# Patient Record
Sex: Male | Born: 1990 | Race: White | Hispanic: No | Marital: Single | Smoking: Current every day smoker
Health system: Southern US, Community
[De-identification: ages and names within clinical notes are randomized; demographics above are authoritative.]

## PROBLEM LIST (undated history)

## (undated) DIAGNOSIS — N179 Acute kidney failure, unspecified: Secondary | ICD-10-CM

## (undated) HISTORY — PX: HIP SURGERY: SHX245

---
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
## 2010-07-10 ENCOUNTER — Encounter: Payer: Self-pay | Admitting: Family Medicine

## 2010-07-10 ENCOUNTER — Encounter: Payer: Self-pay | Admitting: General Surgery

## 2010-11-01 NOTE — H&P (Signed)
[name], [name] NO.:  [id number]   MEDICAL RECORD NO.:  [id number]          PATIENT TYPE:  EMS   LOCATION:  ED                           FACILITY:  [facility]   PHYSICIAN:  Angelia Mould. Derrell Lolling, M.D.DATE OF BIRTH:  July 19, 1990   DATE OF ADMISSION:  12/05/2006  DATE OF DISCHARGE:                              HISTORY & PHYSICAL   CHIEF COMPLAINT:  Abdominal pain.   HISTORY OF PRESENT ILLNESS:  This is a 20 year old white man in good  health.  He presents with a 4-day history of progressive right lower  quadrant pain.  He has not had any similar symptoms in the past.  He  denies fever or chills.  He denies diarrhea or voiding symptoms.  Two  days ago, he had several episodes of nausea and vomiting.  He remains  anorexic, but the pain is getting worse.  He was evaluated at [facility] [place].  He was sent to  [facility] for a CT scan at [facility].  The CT scan shows an  inflamed appendix with some fluid and air bubbles around it and this is  read as showing a ruptured appendix, but no abscess.  No other pathology  was identified.  I was called.  He was sent to [facility] for  my evaluation.  He is being admitted for appendectomy.   PAST HISTORY:  Fractured ankle, otherwise healthy.   CURRENT MEDICATIONS:  None.   DRUG ALLERGIES:  None known.   SOCIAL HISTORY:  He is being home-schooled.  He is a rising 11th and  12th grader.  He denies alcohol or tobacco.  He works at [employer].  His mother and father are both with him today.   FAMILY HISTORY:  Mother living and has hyperlipidemia.  Father living  and has hypertension.   REVIEW OF SYSTEMS:  Fifteen-system review of systems is performed and is  noncontributory except as described above.   PHYSICAL EXAMINATION:  GENERAL:  Healthy young man, a bit overweight, in  moderate distress.  VITAL SIGNS:  Temperature has not been taken,  pulse is 98, respirations  18 and blood pressure has not been taken.  SKIN:  Warm and dry, slightly warm actually.  HEENT:  Eyes:  Sclerae are clear.  Extraocular movements are intact.  Ears, nose, mouth, throat, nose, lips, tongue and oropharynx are without  gross lesions.  NECK:  Supple and nontender.  No adenopathy.  No mass.  No jugular  distention.  LUNGS:  Clear to auscultation.  No chest wall tenderness.  HEART:  Regular rate and rhythm.  No murmur.  Radial, femoral and  posterior pulses are palpable.  No peripheral edema.  ABDOMEN:  Borderline obese, soft, tender with involuntary guarding in the right  lower quadrant and to a lesser extent, the suprapubic area, a lot of  spasticity, not obviously distended, minimal bowel sounds, no hernias  noted.  GENITOURINARY:  Normal penis, scrotum and testes.  I do not feel any  hernias or inguinal masses.  EXTREMITIES:  He moves all 4 extremities well without pain or deformity.  NEUROLOGIC:  No gross motor or sensory deficits.   IMPRESSION:  Acute appendicitis, likely ruptured.   PLAN:  The patient will be started on IV fluids, IV antibiotics and will  be taken to the operating room later today for appendectomy.   I have discussed the indication and details of surgery with the patient,  his mother and his father.  Risks and complications have been outlined,  including, but not limited to, bleeding, infection, conversion to open  laparotomy, injury to adjacent organs such as urinary tract, vascular  structures or intestine with major reconstructive surgery.  Alternative  diagnoses were discussed, wound problems such as infection or hernia,  cardiac, pulmonary and thromboembolic problems.  He seems to understand  these issues well.  At this time, all of his questions are answered.  Both the patient and his parents are in full agreement with this plan.      Angelia Mould. Derrell Lolling, M.D.  Electronically Signed     HMI/MEDQ  D:   12/05/2006  T:  12/06/2006  Job:  [id number]   cc:   Burnell Blanks, MD  Fax: [phone]

## 2010-11-01 NOTE — Op Note (Signed)
[name], [name] NO.:  [id number]   MEDICAL RECORD NO.:  [id number]          PATIENT TYPE:  INP   LOCATION:  0098                         FACILITY:  [facility]   PHYSICIAN:  Angelia Mould. Derrell Lolling, M.D.DATE OF BIRTH:  07-22-1990   DATE OF PROCEDURE:  12/05/2006  DATE OF DISCHARGE:                               OPERATIVE REPORT   PREOPERATIVE DIAGNOSIS:  Ruptured appendicitis.   POSTOPERATIVE DIAGNOSIS:  Ruptured appendicitis.   OPERATION PERFORMED:  Diagnostic laparoscopy, open appendectomy.   SURGEON:  Angelia Mould. Derrell Lolling, M.D.   OPERATIVE INDICATIONS:  This is a 20 year old white man who has had  progressive right lower quadrant abdominal pain for 4 days.  He has had  nausea and vomiting.  Denies fever or chills.  This has been getting  worse and he tried to deal with it at home and finally could not.  He  was sent for CT scanning today and it showed acute appendicitis with  fluid and air bubbles around the appendix suggesting rupture.  There was  no well formed abscess.  He is found to appear ill with tenderness in  the right lower quadrant.  He was started on antibiotics and brought to  operating room urgently.   OPERATIVE FINDINGS:  The appendix was gangrenous, acutely inflamed and  had ruptured.  There was walled off infected fluid around the appendix.  The omentum had walled this off and there was not diffuse peritonitis.  The inflammatory process was quite well developed making the appendix  and the cecum quite rigid and difficult to dissect necessitating open  surgery.   OPERATIVE TECHNIQUE:  Following induction of general endotracheal  anesthesia, a Foley catheter was inserted.  Intravenous antibiotics were  infused.  The abdomen was prepped and draped in sterile fashion.  The  patient was identified as correct patient, correct procedure.  0.5%  Marcaine with epinephrine was used as local infiltration anesthetic.  A  12-mm OptiVu port was placed in the  left rectus muscle just above the  umbilicus uneventfully.  Pneumoperitoneum was created.  Video camera was  inserted.  It was difficult to see although there did appear to be  inflammatory process in the right lower quadrant.  I placed a 5 mm  trocar in the left rectus muscle below the umbilicus and a 12-mm trocar  in the left rectus muscle in the suprapubic area.  We were able to  dissect the omentum off of this process.  There was some purulent fluid  there which was evacuated.  There was a foul odor.  We could see the tip  of the appendix but it was very densely adherent to surrounding  structures and the lateral wall and it just was not possible to dissect  this was safely and so we abandoned the laparoscopic approach.   A right lower quadrant muscle-splitting incision was made going through  the external oblique, internal oblique and transversus abdominis and  peritoneum in steps, placing handheld retractors.  We then identified  the tip of the appendix and with blunt dissection we were able to  mobilize this up slowly.  We had to divide some of the lateral  peritoneal attachments to mobilize the cecum.  The appendiceal mesentery  had to be divided in a couple of areas and tied off with 2-0 Vicryl  ties.  There was one area that kept bleeding and had to be suture  ligated with 2-0 silk suture ligature and that was very adequate for  hemostasis.  We continued dissection until we could clearly see the  junction of the appendix with the cecum.  At that point the appendix was  softer.  We placed a pursestring suture of 2-0 silk around the base of  the appendix in the cecum.  We tied off the appendix with a 2-0 Vicryl  tie and amputated the appendix and sent it to the lab.  The appendiceal  stump was then inverted and a pursestring suture tied.  This provided  very secure closure.  I placed one more suture of 2-0 silk to further  imbricate the tissues but actually we had very good  closure.  The area  was then copiously irrigated with saline and irrigated out with a pool  sucker.  There was no bleeding and no more purulence.  We checked the  terminal ileum and cecum and the omentum and everything looked fine.  The peritoneum was closed with running suture of 2-0 Vicryl.  The  internal oblique was closed with running suture of #1 PDS.  The external  oblique was closed with running suture of #1 PDS.  Skin was closed  loosely with skin staples.  Some Telfa wicks were placed between skin  staples in the right lower quadrant wound to provide drainage.  Clean  bandages were placed and the patient taken recovery room in stable  condition.  Estimated blood loss about 30-50 mL.  Complications none,  sponge, needle, instrument counts were correct.      Angelia Mould. Derrell Lolling, M.D.  Electronically Signed     HMI/MEDQ  D:  12/05/2006  T:  12/06/2006  Job:  [id number]   cc:   Burnell Blanks, MD  Fax: [phone]

## 2010-11-04 NOTE — Discharge Summary (Signed)
NAMELEMUEL, BOODRAM NO.:  [id number]   MEDICAL RECORD NO.:  [id number]          PATIENT TYPE:  INP   LOCATION:  1522                         FACILITY:  [facility]   PHYSICIAN:  Angelia Mould. Derrell Lolling, M.D.DATE OF BIRTH:  1990-12-05   DATE OF ADMISSION:  12/05/2006  DATE OF DISCHARGE:  12/10/2006                               DISCHARGE SUMMARY   FINAL DIAGNOSIS:  Ruptured appendicitis.   OPERATION PERFORMED:  Open appendectomy, date December 05, 2006.   HISTORY:  This is a healthy 20 year old white man, who presented with a  4-day history of progressive right lower quadrant pain.  He had some  nausea and vomiting.  The pain is getting worse.  CT scan was done at  [facility], and this showed an inflamed appendix with some fluid  air bubbles around it, felt to be a ruptured appendix.  No other  pathology was identified.  He was sent to [facility] Emergency Room.  I  was then called to see him.   Healthy young man, parents were present, pulse was 98, respirations 18.   SIGNIFICANT PHYSICAL FINDINGS:  Were the abdomen was soft with  tenderness and involuntary guarding in the right lower quadrant and also  was tender in the suprapubic area, lots of spasticity, not distended.  No hernias or mass noted.  GENITOURINARY:  Penis, scrotum and testes  were normal.  No hernias or other abnormalities.   HOSPITAL COURSE:  The patient was re-hydrated with IV fluids,  intravenous antibiotics were given, and he was taken to operating room.  Right lower quadrant incision was made, and we found that he had  advanced appendicitis with rupture and purulent fluid walled off in the  area, but we were able to get a good closure on the cecum.  We washed  him out very well, and the surgery was concluded.  Post-op, he did  fairly well.  He had spiked a fever to 103 that first night, but then  his temperature stayed down after that.  It took 2 or 3 days for him to  resume diet.  He did have  a fever of 101.7 on December 08, 2006, but then  remained afebrile for 36 hours thereafter.  He advanced in his diet and  activities and was feeling good.  On December 10, 2006, CBC was done which  showed the white blood cell count had come down from 23,000 to 10,000.  He had three bowel movements, tolerated a regular diet, was feeling  better and wanted to go home.  His wound was clean.  His staples were  removed, and Steri-Strips were applied.  He was given a prescription for  Augmentin for three more days and a prescription for Vicodin.  He was  asked to return see me in 3 weeks.      Angelia Mould. Derrell Lolling, M.D.  Electronically Signed     HMI/MEDQ  D:  01/08/2007  T:  01/08/2007  Job:  [id number]   cc:   Burnell Blanks, MD  Fax: [phone]

## 2011-04-05 LAB — CBC
HCT: 34.2 — ABNORMAL LOW
HCT: 38.6
HCT: 47
Hemoglobin: 11.8 — ABNORMAL LOW
Hemoglobin: 13.3
MCHC: 34.8
MCHC: 34.9
MCHC: 35
MCV: 83.7
Platelets: 294
Platelets: 336 — ABNORMAL HIGH
Platelets: 509 — ABNORMAL HIGH
RBC: 4.52
RBC: 4.57
RBC: 4.91
RDW: 12.8
RDW: 13.2
RDW: 13.3
RDW: 13.3
RDW: 13.3
WBC: 10.3 — ABNORMAL HIGH
WBC: 12.1 — ABNORMAL HIGH
WBC: 15.3 — ABNORMAL HIGH
WBC: 23 — ABNORMAL HIGH

## 2011-04-05 LAB — BASIC METABOLIC PANEL
BUN: 10
BUN: 10
CO2: 28
Calcium: 8.6
Calcium: 9
Calcium: 9.7
Chloride: 99
Creatinine, Ser: 0.8
Creatinine, Ser: 0.94
Creatinine, Ser: 0.95
Glucose, Bld: 101 — ABNORMAL HIGH
Glucose, Bld: 105 — ABNORMAL HIGH
Glucose, Bld: 107 — ABNORMAL HIGH
Glucose, Bld: 118 — ABNORMAL HIGH
Potassium: 4.7
Sodium: 136
Sodium: 141

## 2011-04-05 LAB — DIFFERENTIAL
Basophils Absolute: 0
Basophils Absolute: 0
Basophils Relative: 0
Basophils Relative: 0
Basophils Relative: 0
Eosinophils Absolute: 0.1
Eosinophils Absolute: 0.1
Eosinophils Relative: 1
Lymphocytes Relative: 13 — ABNORMAL LOW
Monocytes Relative: 7
Monocytes Relative: 8
Neutro Abs: 10.2 — ABNORMAL HIGH
Neutro Abs: 10.6 — ABNORMAL HIGH
Neutrophils Relative %: 78 — ABNORMAL HIGH
Neutrophils Relative %: 80 — ABNORMAL HIGH
Neutrophils Relative %: 84 — ABNORMAL HIGH

## 2011-04-05 LAB — ANAEROBIC CULTURE

## 2011-04-05 LAB — WOUND CULTURE

## 2011-04-05 LAB — URINALYSIS, ROUTINE W REFLEX MICROSCOPIC
Bilirubin Urine: NEGATIVE
Ketones, ur: 15 — AB
Nitrite: NEGATIVE
Protein, ur: NEGATIVE
Urobilinogen, UA: 0.2
pH: 6

## 2011-04-05 LAB — BODY FLUID CULTURE

## 2011-06-05 ENCOUNTER — Encounter: Payer: Self-pay | Admitting: Emergency Medicine

## 2011-06-05 ENCOUNTER — Emergency Department (HOSPITAL_COMMUNITY)
Admission: EM | Admit: 2011-06-05 | Discharge: 2011-06-05 | Disposition: A | Discharge status: Home / Self Care | Payer: Self-pay | Attending: Emergency Medicine | Admitting: Emergency Medicine

## 2011-06-05 ENCOUNTER — Emergency Department (HOSPITAL_COMMUNITY): Payer: Self-pay

## 2011-06-05 DIAGNOSIS — F17200 Nicotine dependence, unspecified, uncomplicated: Secondary | ICD-10-CM | POA: Insufficient documentation

## 2011-06-05 DIAGNOSIS — J40 Bronchitis, not specified as acute or chronic: Secondary | ICD-10-CM | POA: Insufficient documentation

## 2011-06-05 DIAGNOSIS — R51 Headache: Secondary | ICD-10-CM | POA: Insufficient documentation

## 2011-06-05 DIAGNOSIS — R062 Wheezing: Secondary | ICD-10-CM | POA: Insufficient documentation

## 2011-06-05 MED ORDER — IPRATROPIUM BROMIDE 0.02 % IN SOLN
0.5000 mg | Freq: Once | RESPIRATORY_TRACT | Status: AC
  Administered 2011-06-05: 0.5 mg via RESPIRATORY_TRACT
  Filled 2011-06-05: qty 2.5

## 2011-06-05 MED ORDER — AZITHROMYCIN 250 MG PO TABS: 250.0000 mg | ORAL_TABLET | Freq: Every day | ORAL | Status: AC

## 2011-06-05 MED ORDER — PROMETHAZINE-DM 6.25-15 MG/5ML PO SYRP: 2.5000 mL | ORAL_SOLUTION | Freq: Four times a day (QID) | ORAL | Status: AC | PRN

## 2011-06-05 MED ORDER — ALBUTEROL SULFATE HFA 108 (90 BASE) MCG/ACT IN AERS
2.0000 | INHALATION_SPRAY | RESPIRATORY_TRACT | Status: DC | PRN
  Administered 2011-06-05: 2 via RESPIRATORY_TRACT
  Filled 2011-06-05: qty 6.7

## 2011-06-05 MED ORDER — ALBUTEROL SULFATE (5 MG/ML) 0.5% IN NEBU
5.0000 mg | INHALATION_SOLUTION | Freq: Once | RESPIRATORY_TRACT | Status: AC
  Administered 2011-06-05: 5 mg via RESPIRATORY_TRACT
  Filled 2011-06-05: qty 1

## 2011-06-05 NOTE — ED Provider Notes (Signed)
History    this is a generally healthy 20 year old male with a smoking history is presenting to the ED with chief complaints of productive cough and chest congestion. Patient states for the past month and half he has been having persistent cough. Cough is sometimes productive of green sputum. For the past 2 weeks the cough has gotten worse. Cough is worse at night and in the morning.  He is also complaining of constant headache and described as a vice grip sensation. He also noticed aching pain throughout the body. He denies significant for nose, sore throat, sneezing. He denies, vomiting, diarrhea, abdominal pain, dysuria, rash, or fever. He admits to occasional use of marijuana but denies any recreational drug use. He has tried some over-the-counter medication without relief. He denies history of asthma.    CSN: [id number] Arrival date & time: 06/05/2011  7:13 AM   First MD Initiated Contact with Patient 06/05/11 [phone]      Chief Complaint  Patient presents with  . Headache    (Consider location/radiation/quality/duration/timing/severity/associated sxs/prior treatment) The history is provided by the patient. No language interpreter was used.    History reviewed. No pertinent past medical history.  Past Surgical History  Procedure Date  . Appendectomy     History reviewed. No pertinent family history.  History  Substance Use Topics  . Smoking status: Current Everyday Smoker -- 1.0 packs/day    Types: Cigarettes  . Smokeless tobacco: Not on file  . Alcohol Use: Yes      Review of Systems  All other systems reviewed and are negative.    Allergies  Review of patient's allergies indicates no known allergies.  Home Medications  No current outpatient prescriptions on file.  BP 147/89  Pulse 97  Temp(Src) 98.3 F (36.8 C) (Oral)  Resp 16  SpO2 97%  Physical Exam  Nursing note and vitals reviewed. Constitutional:       Awake, alert, nontoxic appearance  HENT:    Head: Atraumatic.  Right Ear: Hearing and tympanic membrane normal.  Left Ear: Hearing and tympanic membrane normal.  Nose: Nose normal.  Mouth/Throat: Uvula is midline, oropharynx is clear and moist and mucous membranes are normal.  Eyes: Right eye exhibits no discharge. Left eye exhibits no discharge.  Neck: Full passive range of motion without pain. Neck supple. No Brudzinski's sign noted.  Pulmonary/Chest: Effort normal. No respiratory distress. He has decreased breath sounds. He has wheezes. He has rhonchi. He has no rales. He exhibits no tenderness.  Abdominal: There is no tenderness. There is no rebound.  Musculoskeletal: He exhibits no tenderness.       Baseline ROM, no obvious new focal weakness  Neurological:       Mental status and motor strength appears baseline for patient and situation  Skin: No rash noted.  Psychiatric: He has a normal mood and affect.    ED Course  Procedures (including critical care time)  Labs Reviewed - No data to display No results found.   No diagnosis found.    MDM  On examination patient has moderate wheezes throughout all lung fields. He is currently afebrile, with stable normal vital signs.: Obtain a chest x-ray to rule out pneumonia. The breathing treatment will be given for relief of symptoms.   8:48 AM Chest x-ray shows evidence of bronchitis. Z-Pak given. Moderate amount of time were discussed in regard to smoking cessation, which patient's agree.     Fayrene Helper, [place] 06/05/11 [phone]

## 2011-06-05 NOTE — ED Notes (Signed)
Resp. Therp here instructing on inhaler use

## 2011-06-05 NOTE — ED Notes (Signed)
C/o headache, chest and head congestion for the past two weeks--rates headache a 7 on 1-10 scale of pain---Smokes one pack per day but, has been unable to smoke last few days due to [facility] and lung pain

## 2011-06-05 NOTE — ED Notes (Signed)
Pt states has had nasal congestion, headache x several weeks, and had a productive cough with green sputum x 1 month. States some times it hurts to breathe.

## 2011-06-05 NOTE — ED Provider Notes (Signed)
Medical screening examination/treatment/procedure(s) were performed by non-physician practitioner and as supervising physician I was immediately available for consultation/collaboration.   Rolan Bucco, MD 06/05/11 [phone]

## 2012-01-29 ENCOUNTER — Encounter (HOSPITAL_COMMUNITY): Payer: Self-pay

## 2012-01-29 ENCOUNTER — Emergency Department (HOSPITAL_COMMUNITY): Payer: Self-pay

## 2012-01-29 ENCOUNTER — Inpatient Hospital Stay (HOSPITAL_COMMUNITY)
Admission: EM | Admit: 2012-01-29 | Discharge: 2012-01-31 | DRG: 683 | Disposition: A | Discharge status: Home / Self Care | Payer: BC Managed Care – PPO | Attending: Internal Medicine | Admitting: Internal Medicine

## 2012-01-29 DIAGNOSIS — R5381 Other malaise: Secondary | ICD-10-CM | POA: Diagnosis present

## 2012-01-29 DIAGNOSIS — Z720 Tobacco use: Secondary | ICD-10-CM | POA: Diagnosis present

## 2012-01-29 DIAGNOSIS — D72829 Elevated white blood cell count, unspecified: Secondary | ICD-10-CM | POA: Diagnosis present

## 2012-01-29 DIAGNOSIS — R748 Abnormal levels of other serum enzymes: Secondary | ICD-10-CM | POA: Diagnosis present

## 2012-01-29 DIAGNOSIS — F17200 Nicotine dependence, unspecified, uncomplicated: Secondary | ICD-10-CM | POA: Diagnosis present

## 2012-01-29 DIAGNOSIS — E876 Hypokalemia: Secondary | ICD-10-CM | POA: Diagnosis present

## 2012-01-29 DIAGNOSIS — E871 Hypo-osmolality and hyponatremia: Secondary | ICD-10-CM | POA: Diagnosis present

## 2012-01-29 DIAGNOSIS — E860 Dehydration: Secondary | ICD-10-CM

## 2012-01-29 DIAGNOSIS — R0981 Nasal congestion: Secondary | ICD-10-CM | POA: Diagnosis present

## 2012-01-29 DIAGNOSIS — R5383 Other fatigue: Secondary | ICD-10-CM | POA: Diagnosis present

## 2012-01-29 DIAGNOSIS — D45 Polycythemia vera: Secondary | ICD-10-CM | POA: Diagnosis present

## 2012-01-29 DIAGNOSIS — R12 Heartburn: Secondary | ICD-10-CM | POA: Diagnosis present

## 2012-01-29 DIAGNOSIS — R0789 Other chest pain: Secondary | ICD-10-CM | POA: Diagnosis present

## 2012-01-29 DIAGNOSIS — N179 Acute kidney failure, unspecified: Principal | ICD-10-CM | POA: Diagnosis present

## 2012-01-29 DIAGNOSIS — R058 Other specified cough: Secondary | ICD-10-CM | POA: Diagnosis present

## 2012-01-29 DIAGNOSIS — R21 Rash and other nonspecific skin eruption: Secondary | ICD-10-CM | POA: Diagnosis present

## 2012-01-29 DIAGNOSIS — D751 Secondary polycythemia: Secondary | ICD-10-CM | POA: Diagnosis present

## 2012-01-29 DIAGNOSIS — E872 Acidosis, unspecified: Secondary | ICD-10-CM | POA: Diagnosis present

## 2012-01-29 DIAGNOSIS — IMO0001 Reserved for inherently not codable concepts without codable children: Secondary | ICD-10-CM | POA: Diagnosis present

## 2012-01-29 DIAGNOSIS — M6281 Muscle weakness (generalized): Secondary | ICD-10-CM | POA: Diagnosis present

## 2012-01-29 DIAGNOSIS — R05 Cough: Secondary | ICD-10-CM | POA: Diagnosis present

## 2012-01-29 DIAGNOSIS — R112 Nausea with vomiting, unspecified: Secondary | ICD-10-CM | POA: Diagnosis present

## 2012-01-29 DIAGNOSIS — Z23 Encounter for immunization: Secondary | ICD-10-CM

## 2012-01-29 HISTORY — DX: Rash and other nonspecific skin eruption: R21

## 2012-01-29 HISTORY — DX: Hypo-osmolality and hyponatremia: E87.1

## 2012-01-29 HISTORY — DX: Abnormal levels of other serum enzymes: R74.8

## 2012-01-29 HISTORY — DX: Acute kidney failure, unspecified: N17.9

## 2012-01-29 LAB — CBC
Hemoglobin: 18.6 g/dL — ABNORMAL HIGH (ref 13.0–17.0)
Platelets: 332 10*3/uL (ref 150–400)
RBC: 6.02 MIL/uL — ABNORMAL HIGH (ref 4.22–5.81)
WBC: 20.4 10*3/uL — ABNORMAL HIGH (ref 4.0–10.5)

## 2012-01-29 LAB — COMPREHENSIVE METABOLIC PANEL
ALT: 37 U/L (ref 0–53)
ALT: 29 U/L (ref 0–53)
AST: 34 U/L (ref 0–37)
AST: 22 U/L (ref 0–37)
Albumin: 5.9 g/dL — ABNORMAL HIGH (ref 3.5–5.2)
Alkaline Phosphatase: 82 U/L (ref 39–117)
CO2: 18 mEq/L — ABNORMAL LOW (ref 19–32)
CO2: 19 mEq/L (ref 19–32)
Chloride: 78 mEq/L — ABNORMAL LOW (ref 96–112)
Chloride: 85 mEq/L — ABNORMAL LOW (ref 96–112)
Creatinine, Ser: 4.64 mg/dL — ABNORMAL HIGH (ref 0.50–1.35)
GFR calc Af Amer: 23 mL/min — ABNORMAL LOW (ref >90)
GFR calc non Af Amer: 17 mL/min — ABNORMAL LOW (ref >90)
GFR calc non Af Amer: 20 mL/min — ABNORMAL LOW (ref >90)
Glucose, Bld: 167 mg/dL — ABNORMAL HIGH (ref 70–99)
Potassium: 3.7 mEq/L (ref 3.5–5.1)
Sodium: 125 mEq/L — ABNORMAL LOW (ref 135–145)
Sodium: 127 mEq/L — ABNORMAL LOW (ref 135–145)
Total Bilirubin: 0.6 mg/dL (ref 0.3–1.2)
Total Bilirubin: 0.5 mg/dL (ref 0.3–1.2)

## 2012-01-29 LAB — CBC WITH DIFFERENTIAL/PLATELET
Basophils Relative: 0 % (ref 0–1)
Eosinophils Relative: 0 % (ref 0–5)
Hemoglobin: 22 g/dL (ref 13.0–17.0)
Lymphocytes Relative: 5 % — ABNORMAL LOW (ref 12–46)
MCH: 31.4 pg (ref 26.0–34.0)
Monocytes Relative: 5 % (ref 3–12)
Neutrophils Relative %: 90 % — ABNORMAL HIGH (ref 43–77)
Platelets: 494 10*3/uL — ABNORMAL HIGH (ref 150–400)
RBC: 7 MIL/uL — ABNORMAL HIGH (ref 4.22–5.81)
WBC: 27.3 10*3/uL — ABNORMAL HIGH (ref 4.0–10.5)

## 2012-01-29 LAB — CK: Total CK: 941 U/L — ABNORMAL HIGH (ref 7–232)

## 2012-01-29 MED ORDER — SODIUM CHLORIDE 0.9 % IV SOLN
Freq: Once | INTRAVENOUS | Status: AC
  Administered 2012-01-29: 19:00:00 via INTRAVENOUS

## 2012-01-29 MED ORDER — ONDANSETRON HCL 4 MG PO TABS: 4.0000 mg | ORAL_TABLET | Freq: Four times a day (QID) | ORAL | Status: DC | PRN

## 2012-01-29 MED ORDER — SODIUM CHLORIDE 0.9 % IV BOLUS (SEPSIS)
1000.0000 mL | Freq: Once | INTRAVENOUS | Status: AC
Start: 2012-01-29 — End: 2012-01-30
  Administered 2012-01-30: 1000 mL via INTRAVENOUS

## 2012-01-29 MED ORDER — ONDANSETRON HCL 4 MG/2ML IJ SOLN: 4.0000 mg | Freq: Four times a day (QID) | INTRAMUSCULAR | Status: DC | PRN

## 2012-01-29 MED ORDER — SODIUM CHLORIDE 0.9 % IV SOLN
INTRAVENOUS | Status: DC
  Administered 2012-01-30: 01:00:00 via INTRAVENOUS

## 2012-01-29 NOTE — ED Notes (Signed)
Pt here with nausea, rash to abd, weakness, not feeling well.

## 2012-01-29 NOTE — ED Notes (Signed)
Critical lab: hemoglobin 22.0, Dr. Judd Lien notified

## 2012-01-29 NOTE — ED Notes (Signed)
ASked patient to provide urine sample.  He has said he is still unable to urinate at this time and requested apple juice.

## 2012-01-29 NOTE — H&P (Signed)
Hospital Admission Note Date: 01/29/2012  Patient name: [name] Medical record number: [id number] Date of birth: 1991/04/19 Age: 21 y.o. Gender: male PCP: DEFAULT,PROVIDER, MD  Medical Service:  Attending physician:     1st Contact:     Pager: 2nd Contact:     Pager: After 5 pm or weekends: 1st Contact:      Pager: [phone] 2nd Contact:      Pager: [phone]  Chief Complaint: Vomiting/malaise  History of Present Illness: Patient is a 21 y.o. M with no PMH. He presented to [facility] ED on 01/29/2012 with the complaints of nausea, vomiting, malaise, fatigue, and muscle cramps.  The patient had a cold approximately 10 days ago, which resolved spontaneously approximately 4 days prior to admission (significant only for nasal congestion and cough). Following resolution of these cold symptoms, the patient states that he noticed a rash developing around umbilicus, which extended to cover the entire abdomen, as well as his chest, back, arms, and legs over the course of 24 hours, beginning approximately 3-4 days prior to admission. He states that the rash has been persistent since this time. He complains of mild pruritis, but no pain associated with the lesions. He states he was seen in the ED at an OSH in [place] one day prior to admission, where he was given a three-day supply of diflucan and as he was not feeling quite as ill at that time was sent home without any further workup.   On the day of admission, he developed significant nausea and vomiting, as well as oliguria (possibly anuria, as patient states he might have possibly had some urine output earlier in the day but wasn't sure). He states that he had 8 episodes of emesis total, including one episode in the ED. He denies bloody or bilious emesis (to note, emesis observed on exam was brown, but patient was drinking a coke and color was lighter than typically observed with hematemesis). Patient denies any changes in urine output prior to today.    Patient states [place] and spends a significant amount of time outdoors. He admits to multiple tick bites over the last few months, the most recent of which he noticed approximately 2 months prior to admission. He states that he kept the tick for 2 weeks in the event that he developed symptoms, but subsequently discarded it as he did not become ill. 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 He states that during the last few days he has become tired very quickly while out in the sun. Additionally, he states becoming very fatigued on changing a tire in the sun within the last couple of days, and states that this is usually an easy task for him to complete without needing to rest (had to rest 3 times during event).   No current outpatient prescriptions on file.  Allergies: Allergies as of 01/29/2012  . (No Known Allergies)   No past medical history on file. Past Surgical History  Procedure Date  . Appendectomy    No family history on file. History   Social History  . Marital Status: Single    Spouse Name: N/A    Number of Children: N/A  . Years of Education: N/A   Occupational History  . Not on file.   Social History Main Topics  . Smoking status: Current Everyday Smoker -- 1.0 packs/day    Types: Cigarettes  . Smokeless tobacco: Not  on file  . Alcohol Use: Yes  . Drug Use: 7 per week    Special: Marijuana  . Sexually Active:    Other Topics Concern  . Not on file   Social History Narrative  . No narrative on file    Review of Systems: Pertinent +/- findings below. ROS otherwise per HPI.  General: Denies headache. Complains of fatigue, malaise, generalized weakness. Denies fever (has been taking temperature regularly at home the last 3-4 days). CV: Complains of bilateral chest pain x 1 day, which he believes is associated with breathing. Denies palpitations, leg  swelling. Resp: Complains of SOB x 2 days.  Abd: Complains of abdominal pain x 1 day. Complains of nausea, vomiting. Denies diarrhea or changes in bowel movements. GU: Complains of testicular pain approximately 1 week prior to presentation, and subsequent erectile dysfunction which has persisted since this time. Tried OTC erectile dysfunction agent which did not improve symptoms.  Physical Exam: Blood pressure 119/70, pulse 97, temperature 97.4 F (36.3 C), temperature source Oral, resp. rate 18, SpO2 92.00%. BP 118/91  Pulse 98  Temp 97.4 F (36.3 C) (Oral)  Resp 18  SpO2 96%  General Appearance:    Alert, cooperative, no distress, appears stated age  Head:    Normocephalic, without obvious abnormality, atraumatic, no ticks appreciated on cursory survey of scalp  Eyes:    PERRL, conjunctiva/corneas clear, EOM's intact, fundi    benign, both eyes       Nose:   Nares normal, septum midline, mucosa normal, no drainage    or sinus tenderness  Throat:   Dry mucus membranes.  Neck:   Supple, symmetrical, trachea midline, no adenopathy;       thyroid:  No enlargement/tenderness/nodules  Lungs:     Clear to auscultation bilaterally, respirations unlabored  Chest wall:    No tenderness or deformity  Heart:    Regular rate and rhythm, S1 and S2 normal, no murmur, rub   or gallop  Abdomen:     Soft, diffusely tender, bowel sounds active all four quadrants,    no masses, no organomegaly  Extremities:   Extremities normal, atraumatic, no cyanosis or edema  Pulses:   2+ and symmetric all extremities  Skin:   Maculopapular rash appreciated on trunk as well as all extremities. Rash is most concentrated on abdomen. In appearance, lesions are 1-69mm, maculopapular, with occasional pustulation; no ecchymoses, or other lesions appreciated     Neurologic:   Normal strength, sensation and reflexes      throughout   Lab results: Basic Metabolic Panel:  Basename 01/29/12 1800  NA 125*  K 3.7  CL 78*   CO2 18*  GLUCOSE 208*  BUN 69*  CREATININE 4.64*  CALCIUM 10.6*  MG --  PHOS --   Liver Function Tests:  Basename 01/29/12 1800  AST 34  ALT 37  ALKPHOS 97  BILITOT 0.6  PROT 10.5*  ALBUMIN 5.9*   CBC:  Basename 01/29/12 1800  WBC 27.3*  NEUTROABS 24.5*  HGB 22.0*  HCT 55.3*  MCV 79.0  PLT 494*   Cardiac Enzymes:  Basename 01/29/12 2005 01/29/12 1956  CKTOTAL -- 941*  CKMB -- --  CKMBINDEX -- --  TROPONINI <0.30 --   Imaging results:  Dg Chest Port 1 View  01/29/2012  *RADIOLOGY REPORT*  Clinical Data: Cough.  Leukocytosis.  PORTABLE CHEST - 1 VIEW  Comparison: 06/05/2011  Findings: Negative for pneumonia or effusion.  Lungs are clear. Negative for heart failure.  IMPRESSION: No active cardiopulmonary disease.  Original Report Authenticated By: Camelia Phenes, M.D.   Other results: EKG: Sinus tachycardia; rate ~110-115. T-wave inversion in III, aVF. Prominent P-waves in inferior leads.  ASSESSMENT AND PLAN: Patient is a 21 y.o. M without known PMH who presents with nausea, vomiting, chest pain, SOB, fatigue, malaise. He was admitted for acute kidney injury, as well as for AG metabolic acidosis, nausea/vomiting/rash, and chest pain/SOB  Acute kidney injury - Creatinine on admission = 4.6. Baseline unknown, but unlikely >1, given patient's lack of PMH and his relatively young age. Subsequent creatinine after IV hydration revealed creatinine decreased from 4.6 to 4.0. There is significant concern at present for the development of ATN and/or the development of heme-pigment mediated kidney injury (the latter being a concern due to the patient's CK ~ 1000.  - admit to stepdown - monitor vitals - monitor I/O's - IV rehydration with normal saline - BMETs q4 - check urine myoglobin  Anion gap metabolic acidosis - AG = 29 on presentation, decreased to 23 after IV hydration with NS. Etiology unknown. Patient has significant volume depletion 2/2 insensible losses.  Interventions at this time will be directed towards further investigated the cause of patient's AG metabolic acidosis, as well as treating with IV normal saline and monitoring for resolution. - Check ABG - BMETs q4 - check UA - check urine ketones - continue to monitor blood glucose, as patient had glucose ~200 on admission  Chest pain - Etiology unknown. Patient states pain is associated with breathing, however also states it has a hearburn component. ACS unlikely given age and negative troponin, although EKG revealed TWI in leads III and aVF. GENEVA score = 5, however d-dimer was negative, thus pulmonary embolism less likely. Pneumonia unlikely given negative CXR.  - check CE - repeat EKG in AM  Rash/nausea/vomiting - These symptoms likely share an underlying etiology given temporal relationship in presentation and patient's lack of history of these symptoms in the past. At this time it is unclear what the cause of these symptoms is, and whether it might be related to the patient's AKI and AG metabolic acidosis.  Patient has history of tick bites, however is afebrile, which would be highly atypical presentation of RMSF. Drug eruption is possible, although patient denies any drug use, either illicit or as prescribed, other than recent ingestion of adderall (taken not as prescribed). Viral illnesses such as mononucleosis and other viral exanthems might explain his symptoms, but these also would more commonly be accompanied by febrile illness. Syphilis is a possibility, as is HIV. Interventions at this time will be directed towards investigating the underlying etiology of these symptoms, and towards treating the symptoms supportively until resolved. - check mononucleosis serology - check RMSF serology (although unlikely to be positive <5 days following onset of symptoms) -  check HIV serology - check RPR - consider skin biopsy if lesions do not improve or worsen in 24 hours  Hyponatremia - Na on  admission = 125. Likely secondary to insensible losses. Other underlying etiologies undetermined at this point.  - IV normal saline (with goal Na = 140, but with 24-hr correction goal <68mEq/L, i.e. Na <138 at 1800 on 01/30/2012)  Hypermagnesemia/hyperphosphatemia/hypercalcemia - patient had all three aforementioned electrolyte abnormalities at presentation, Ca subsequently corrected following IV fluid hydration (10.6 -> 9.3). As with other labs, patient appears to have marked hemoconcentration which makes understanding the chronicity of these abnormalities very difficult at this time. Interventions will be directed towards  repleting the volume deficit and reassessing the patient's serum electrolytes periodically. - IV normal saline - BMETs q4 - if symptoms change, will consider further interventions for electrolyte derangements as necessary  Leukocytosis - unclear etiology. If the patient truly has an infectious process driving his symptomatology and laboratory abnormalities, it is likely that this value is a result of this. If the patient does not have an acute infection, it is possible but unlikely that this could represent a stress response (the patient is very hemoconcentrated and WBC decreased from 27.3 to 20.4 after IV hydration).  - monitor vitals - monitor CBCs  Polycythemia - Hgb = 22.0 on admission. Hgb = 18.6 after rehydration. Likely largely due to hemoconcentration.  - monitor CBCs  Signed: Elenor Legato, M.D. 01/29/2012, 9:57 PM

## 2012-01-29 NOTE — ED Notes (Signed)
Apple juice given, pts family/friend at bedside.

## 2012-01-29 NOTE — ED Notes (Signed)
Patient vomited all of the apple juice and more.  Linens changed, gown changed.  Significant other in room with patient

## 2012-01-29 NOTE — ED Provider Notes (Signed)
History     CSN: [id number]  Arrival date & time 01/29/12  1739   First MD Initiated Contact with Patient 01/29/12 1851      Chief Complaint  Patient presents with  . Generalized Body Aches  . URI  . Nausea  . Rash    (Consider location/radiation/quality/duration/timing/severity/associated sxs/prior treatment) HPI Comments: Patient with uri-like symptoms one week ago, then developed rash all over.  Since that time, he has become increasingly weak, fatigued, tired.  Is now "cramping up all over".  Feels thirsty.  Was seen at outside facility and was discharged without much workup.  He comes here with worsening of symptoms.    Patient is a 21 y.o. male presenting with weakness. The history is provided by the patient.  Weakness The primary symptoms include dizziness and nausea. Primary symptoms do not include fever. Episode onset: one week ago. The symptoms are worsening. The neurological symptoms are diffuse.  Dizziness also occurs with nausea, weakness and diaphoresis.  Additional symptoms include weakness.    No past medical history on file.  Past Surgical History  Procedure Date  . Appendectomy     No family history on file.  History  Substance Use Topics  . Smoking status: Current Everyday Smoker -- 1.0 packs/day    Types: Cigarettes  . Smokeless tobacco: Not on file  . Alcohol Use: Yes      Review of Systems  Constitutional: Positive for diaphoresis, appetite change and fatigue. Negative for fever and chills.  Gastrointestinal: Positive for nausea.  Neurological: Positive for dizziness and weakness.  All other systems reviewed and are negative.    Allergies  Review of patient's allergies indicates no known allergies.  Home Medications  No current outpatient prescriptions on file.  BP 114/62  Pulse 101  Temp 97.3 F (36.3 C) (Oral)  Resp 18  SpO2 93%  Physical Exam  Nursing note and vitals reviewed. Constitutional: He is oriented to person,  place, and time.       Appears pale, uncomfortable.  HENT:  Head: Normocephalic.       Oropharynx is dry  Eyes: EOM are normal. Pupils are equal, round, and reactive to light.  Neck: Normal range of motion. Neck supple.  Cardiovascular: Normal rate and regular rhythm.   No murmur heard. Pulmonary/Chest: Effort normal and breath sounds normal. No respiratory distress. He has no wheezes.  Abdominal: Soft. Bowel sounds are normal. He exhibits no distension. There is no tenderness.  Musculoskeletal: Normal range of motion. He exhibits no edema.  Lymphadenopathy:    He has no cervical adenopathy.  Neurological: He is alert and oriented to person, place, and time. No cranial nerve deficit. Coordination normal.  Skin: Skin is warm and dry.    ED Course  Procedures (including critical care time)  Labs Reviewed  COMPREHENSIVE METABOLIC PANEL - Abnormal; Notable for the following:    Sodium 125 (*)     Chloride 78 (*)     CO2 18 (*)     Glucose, Bld 208 (*)     BUN 69 (*)     Creatinine, Ser 4.64 (*)     Calcium 10.6 (*)     Total Protein 10.5 (*)     Albumin 5.9 (*)     GFR calc non Af Amer 17 (*)     GFR calc Af Amer 19 (*)     All other components within normal limits  CBC WITH DIFFERENTIAL  URINALYSIS, ROUTINE W REFLEX MICROSCOPIC  ROCKY  MTN SPOTTED FVR AB, IGG-BLOOD  ROCKY MTN SPOTTED FVR AB, IGM-BLOOD  CBC WITH DIFFERENTIAL  COMPREHENSIVE METABOLIC PANEL  TROPONIN I   No results found.   No diagnosis found.   Date: 01/29/2012  Rate: 112  Rhythm: normal sinus rhythm  QRS Axis: normal  Intervals: normal  ST/T Wave abnormalities: nonspecific T wave changes  Conduction Disutrbances:none  Narrative Interpretation:   Old EKG Reviewed: none available    MDM  The patient presents with severe weakness and "cramping all over" that has been progressing since last week.  On exam, the patient appears quite dehydrated.  He is pale and his eyes are sunken.  The mucous  membranes are dry.  IVF's were initiated and workup was performed.  This revealed acute renal failure, elevated Hb, and elevated wbc, all consistent with severe dehydration.  I have consulted internal medicine for admission.          Geoffery Lyons, MD 01/30/12 0201

## 2012-01-30 ENCOUNTER — Encounter (HOSPITAL_COMMUNITY): Payer: Self-pay | Admitting: Emergency Medicine

## 2012-01-30 DIAGNOSIS — E872 Acidosis: Secondary | ICD-10-CM

## 2012-01-30 DIAGNOSIS — E876 Hypokalemia: Secondary | ICD-10-CM

## 2012-01-30 DIAGNOSIS — R12 Heartburn: Secondary | ICD-10-CM | POA: Diagnosis present

## 2012-01-30 DIAGNOSIS — R21 Rash and other nonspecific skin eruption: Secondary | ICD-10-CM

## 2012-01-30 DIAGNOSIS — N179 Acute kidney failure, unspecified: Principal | ICD-10-CM

## 2012-01-30 DIAGNOSIS — E871 Hypo-osmolality and hyponatremia: Secondary | ICD-10-CM

## 2012-01-30 DIAGNOSIS — R5381 Other malaise: Secondary | ICD-10-CM

## 2012-01-30 HISTORY — DX: Hypokalemia: E87.6

## 2012-01-30 LAB — CBC
HCT: 44 % (ref 39.0–52.0)
Hemoglobin: 16.9 g/dL (ref 13.0–17.0)
MCH: 30.8 pg (ref 26.0–34.0)
MCHC: 38.4 g/dL — ABNORMAL HIGH (ref 30.0–36.0)
MCV: 80.3 fL (ref 78.0–100.0)

## 2012-01-30 LAB — BASIC METABOLIC PANEL
BUN: 58 mg/dL — ABNORMAL HIGH (ref 6–23)
Calcium: 8.3 mg/dL — ABNORMAL LOW (ref 8.4–10.5)
Calcium: 9.1 mg/dL (ref 8.4–10.5)
Creatinine, Ser: 2.24 mg/dL — ABNORMAL HIGH (ref 0.50–1.35)
GFR calc non Af Amer: 40 mL/min — ABNORMAL LOW (ref >90)
GFR calc non Af Amer: 70 mL/min — ABNORMAL LOW (ref >90)
Glucose, Bld: 162 mg/dL — ABNORMAL HIGH (ref 70–99)
Glucose, Bld: 101 mg/dL — ABNORMAL HIGH (ref 70–99)
Sodium: 130 mEq/L — ABNORMAL LOW (ref 135–145)

## 2012-01-30 LAB — HEMOGLOBIN A1C: Mean Plasma Glucose: 108 mg/dL (ref <117)

## 2012-01-30 LAB — OSMOLALITY, URINE: Osmolality, Ur: 576 mOsm/kg (ref 390–1090)

## 2012-01-30 LAB — MAGNESIUM: Magnesium: 2.7 mg/dL — ABNORMAL HIGH (ref 1.5–2.5)

## 2012-01-30 LAB — URINALYSIS, ROUTINE W REFLEX MICROSCOPIC
Glucose, UA: NEGATIVE mg/dL
Protein, ur: 30 mg/dL — AB
Specific Gravity, Urine: 1.026 (ref 1.005–1.030)
Urobilinogen, UA: 0.2 mg/dL (ref 0.0–1.0)

## 2012-01-30 LAB — POCT I-STAT 3, ART BLOOD GAS (G3+)
Bicarbonate: 23.2 mEq/L (ref 20.0–24.0)
O2 Saturation: 95 %
Patient temperature: 98.6
TCO2: 24 mmol/L (ref 0–100)

## 2012-01-30 LAB — RAPID URINE DRUG SCREEN, HOSP PERFORMED
Barbiturates: NOT DETECTED
Benzodiazepines: NOT DETECTED
Tetrahydrocannabinol: NOT DETECTED

## 2012-01-30 LAB — DRUGS OF ABUSE SCREEN W/O ALC, ROUTINE URINE
Barbiturate Quant, Ur: NEGATIVE
Cocaine Metabolites: NEGATIVE
Methadone: NEGATIVE
Phencyclidine (PCP): NEGATIVE

## 2012-01-30 LAB — ROCKY MTN SPOTTED FVR AB, IGG-BLOOD: RMSF IgG: 0.51 IV

## 2012-01-30 LAB — URINE MICROSCOPIC-ADD ON

## 2012-01-30 LAB — LACTATE DEHYDROGENASE: LDH: 196 U/L (ref 94–250)

## 2012-01-30 LAB — ROCKY MTN SPOTTED FVR AB, IGM-BLOOD: RMSF IgM: 0.98 IV — ABNORMAL HIGH (ref 0.00–0.89)

## 2012-01-30 LAB — KETONES, QUALITATIVE: Acetone, Bld: NEGATIVE

## 2012-01-30 LAB — LIPASE, BLOOD: Lipase: 26 U/L (ref 11–59)

## 2012-01-30 LAB — MONONUCLEOSIS SCREEN: Mono Screen: NEGATIVE

## 2012-01-30 LAB — PROTIME-INR: INR: 1.17 (ref 0.00–1.49)

## 2012-01-30 MED ORDER — SODIUM CHLORIDE 0.9 % IV SOLN
INTRAVENOUS | Status: DC
  Administered 2012-01-30 – 2012-01-31 (×4): via INTRAVENOUS
  Filled 2012-01-30 (×7): qty 1000

## 2012-01-30 MED ORDER — DOXYCYCLINE HYCLATE 100 MG IV SOLR
100.0000 mg | Freq: Two times a day (BID) | INTRAVENOUS | Status: DC
  Administered 2012-01-30 (×3): 100 mg via INTRAVENOUS
  Filled 2012-01-30 (×5): qty 100

## 2012-01-30 MED ORDER — NICOTINE 14 MG/24HR TD PT24
14.0000 mg | MEDICATED_PATCH | Freq: Every day | TRANSDERMAL | Status: DC
  Administered 2012-01-30: 14 mg via TRANSDERMAL
  Filled 2012-01-30: qty 1

## 2012-01-30 MED ORDER — DIPHENHYDRAMINE HCL 25 MG PO CAPS: 25.0000 mg | ORAL_CAPSULE | Freq: Four times a day (QID) | ORAL | Status: DC | PRN

## 2012-01-30 MED ORDER — PNEUMOCOCCAL VAC POLYVALENT 25 MCG/0.5ML IJ INJ
0.5000 mL | INJECTION | INTRAMUSCULAR | Status: AC
  Administered 2012-01-31: 0.5 mL via INTRAMUSCULAR
  Filled 2012-01-30: qty 0.5

## 2012-01-30 MED ORDER — POTASSIUM CHLORIDE CRYS ER 20 MEQ PO TBCR
40.0000 meq | EXTENDED_RELEASE_TABLET | Freq: Once | ORAL | Status: AC
  Administered 2012-01-30: 40 meq via ORAL
  Filled 2012-01-30: qty 2

## 2012-01-30 MED ORDER — FAMOTIDINE 40 MG PO TABS
40.0000 mg | ORAL_TABLET | Freq: Every day | ORAL | Status: DC
  Administered 2012-01-30 – 2012-01-31 (×2): 40 mg via ORAL
  Filled 2012-01-30 (×2): qty 1

## 2012-01-30 MED ORDER — NICOTINE 21 MG/24HR TD PT24
21.0000 mg | MEDICATED_PATCH | Freq: Every day | TRANSDERMAL | Status: DC
  Administered 2012-01-30 – 2012-01-31 (×2): 21 mg via TRANSDERMAL
  Filled 2012-01-30 (×2): qty 1

## 2012-01-30 MED ORDER — ALUM & MAG HYDROXIDE-SIMETH 200-200-20 MG/5ML PO SUSP
30.0000 mL | ORAL | Status: DC | PRN
  Administered 2012-01-30: 30 mL via ORAL
  Filled 2012-01-30: qty 30

## 2012-01-30 MED ORDER — ACETAMINOPHEN 650 MG RE SUPP: 650.0000 mg | Freq: Four times a day (QID) | RECTAL | Status: DC | PRN

## 2012-01-30 MED ORDER — ACETAMINOPHEN 325 MG PO TABS: 650.0000 mg | ORAL_TABLET | Freq: Four times a day (QID) | ORAL | Status: DC | PRN

## 2012-01-30 NOTE — Progress Notes (Signed)
Utilization review completed.  

## 2012-01-30 NOTE — Progress Notes (Signed)
ED RT paged ABG Results from last night: results were unsuccessfully loaded to EPIC and are as follows:  pH: 7.36 pCO2: 40.5 pO2: 80 Bicarb: 23

## 2012-01-30 NOTE — H&P (Signed)
IM Attending  18 man with a rash illness of 7-10 days duration w/o fever or headache.  Developed anorexia and then severe vomiting.  Came in with nl temp but astounding labs: Na = 125, creat = 4.6, anion gap = 29, albumin = 5.9, Hgb = 22,000.  LFTs nl. Lactate, aspirin, ketones negative.  No hint of toxic ingestion. Overnight after large infusion of IV saline: creat = 2.2, anion gap = 14, hgb = 17,000.  On doxycycline. He feels washed out but has minimal focal sx.  Rash is papular erythema with mild pruritis. Impression:  Rash illness.  has outdoor exposures but no fever or headache.  Most of his dramatic manifestations are due to volume contraction.

## 2012-01-30 NOTE — Progress Notes (Signed)
Subjective: Vomiting resolved, c/o heart burn; denies sob; denies ab pain.  Pt reports increased urine output pressure sensation when urination.  Pt reports aching muscles in his arms and legs.  Pt able to tolerate po intake. Pt reports he has been sleepy all day possibly b/c he has not slept in 2 days.    Objective: Vital signs in last 24 hours: Filed Vitals:   01/30/12 0414 01/30/12 0800 01/30/12 1200 01/30/12 1600  BP: 119/67 127/73 145/82   Pulse: 78     Temp: 97.9 F (36.6 C) 97.9 F (36.6 C) 98 F (36.7 C) 98.1 F (36.7 C)  TempSrc: Oral Oral Oral Oral  Resp: 19     Height:      Weight:      SpO2: 96%      Weight change:   Intake/Output Summary (Last 24 hours) at 01/30/12 1802 Last data filed at 01/30/12 1500  Gross per 24 hour  Intake 976.67 ml  Output   2125 ml  Net -1148.33 ml   Vitals reviewed. General: resting in bed, drowsy but arousable, NAD HEENT: [place]/AT, no scleral icterus Cardiac: RRR, no rubs, murmurs or gallops Pulm: clear to auscultation bilaterally, no wheezes, rales, or rhonchi Abd: soft, min TTP ruq, nondistended, BS present (normactive) Ext: warm and well perfused, no pedal edema Neuro: alert and oriented X3  Lab Results: Basic Metabolic Panel:  Lab 01/30/12 4098 01/30/12 0355 01/29/12 2330  NA 130* 128* --  K 3.3* 3.0* --  CL 93* 90* --  CO2 27 24 --  GLUCOSE 101* 162* --  BUN 43* 58* --  CREATININE 1.41* 2.24* --  CALCIUM 9.1 8.3* --  MG -- -- 2.7*  PHOS -- -- 6.9*   Liver Function Tests:  Lab 01/29/12 2203 01/29/12 1800  AST 22 34  ALT 29 37  ALKPHOS 82 97  BILITOT 0.5 0.6  PROT 9.0* 10.5*  ALBUMIN 4.9 5.9*    Lab 01/29/12 2330  LIPASE 26  AMYLASE --   CBC:  Lab 01/30/12 0355 01/29/12 2203 01/29/12 1800  WBC 16.0* 20.4* --  NEUTROABS -- -- 24.5*  HGB 16.9 18.6* --  HCT 44.0 48.3 --  MCV 80.3 80.2 --  PLT 316 332 --   Cardiac Enzymes:  Lab 01/30/12 0855 01/29/12 2005 01/29/12 1956  CKTOTAL 883* -- 941*  CKMB --  -- --  CKMBINDEX -- -- --  TROPONINI -- <0.30 --   D-Dimer:  Lab 01/29/12 2330  DDIMER 0.22   Hemoglobin A1C:  Lab 01/29/12 2330  HGBA1C 5.4   Thyroid Function Tests:  Lab 01/29/12 2330  TSH 1.308  T4TOTAL --  FREET4 --  T3FREE --  THYROIDAB --   Coagulation:  Lab 01/29/12 2330  LABPROT 15.1  INR 1.17   Urine Drug Screen: Drugs of Abuse     Component Value Date/Time   LABOPIA POSITIVE* 01/30/2012 0100   LABOPIA POSITIVE* 01/30/2012 0100   COCAINSCRNUR NEGATIVE 01/30/2012 0100   COCAINSCRNUR NONE DETECTED 01/30/2012 0100   LABBENZ NEGATIVE 01/30/2012 0100   LABBENZ NONE DETECTED 01/30/2012 0100   AMPHETMU POSITIVE* 01/30/2012 0100   AMPHETMU POSITIVE* 01/30/2012 0100   THCU NONE DETECTED 01/30/2012 0100   LABBARB NONE DETECTED 01/30/2012 0100    Urinalysis:  Lab 01/30/12 0100  COLORURINE YELLOW  LABSPEC 1.026  PHURINE 5.5  GLUCOSEU NEGATIVE  HGBUR TRACE*  BILIRUBINUR SMALL*  KETONESUR NEGATIVE  PROTEINUR 30*  UROBILINOGEN 0.2  NITRITE NEGATIVE  LEUKOCYTESUR NEGATIVE   Misc. Labs: Results  for KAINON, VARADY (MRN [id number]) as of 01/30/2012 18:06  Ref. Range 01/30/2012 00:00  pH, Arterial Latest Range: 7.350-7.450  7.365  pCO2 arterial Latest Range: 35.0-45.0 mmHg 40.5  pO2, Arterial Latest Range: 80.0-100.0 mmHg 80.0  Bicarbonate Latest Range: 20.0-24.0 mEq/L 23.2  TCO2 Latest Range: 0-100 mmol/L 24  Acid-base deficit Latest Range: 0.0-2.0 mmol/L 2.0  O2 Saturation No range found 95.0  Patient temperature No range found 98.6 F   Results for MANU, RUBEY (MRN [id number]) as of 01/30/2012 18:06  Ref. Range 01/29/2012 23:30  Phosphorus Latest Range: 2.3-4.6 mg/dL 6.9 (H)  Magnesium Latest Range: 1.5-2.5 mg/dL 2.7 (H)  Lipase Latest Range: 11-59 U/L 26   Results for BRENTIN, SHIN (MRN [id number]) as of 01/30/2012 18:06  Ref. Range 01/30/2012 08:55  CK Total Latest Range: 7-232 U/L 883 (H)   Results for MICAHEL, OMLOR (MRN [id number]) as of 01/30/2012 18:06  Ref.  Range 01/29/2012 23:30  LDH Latest Range: 94-250 U/L 196   Results for KOHEI, ANTONELLIS (MRN [id number]) as of 01/30/2012 18:06  Ref. Range 01/29/2012 23:30 01/29/2012 23:31  Lactic Acid, Venous Latest Range: 0.5-2.2 mmol/L  1.8   Results for HOVANES, HYMAS (MRN [id number]) as of 01/30/2012 18:06  Ref. Range 01/29/2012 23:30  Haptoglobin Latest Range: 45-215 mg/dL 536   Results for AMORE, GRATER (MRN [id number]) as of 01/30/2012 18:06  Ref. Range 01/29/2012 23:30  D-Dimer, Quant Latest Range: 0.00-0.48 ug/mL-FEU 0.22  Prothrombin Time Latest Range: 11.6-15.2 seconds 15.1  INR Latest Range: 0.00-1.49  1.17  aPTT Latest Range: 24-37 seconds 43 (H)   Results for SANEL, STEMMER (MRN [id number]) as of 01/30/2012 18:06  Ref. Range 01/30/2012 00:42  Salicylate Lvl Latest Range: 2.8-20.0 mg/dL <4.3 (L)   Results for CORAL, SOLER (MRN [id number]) as of 01/30/2012 18:06  Ref. Range 01/29/2012 23:30  TSH Latest Range: 0.350-4.500 uIU/mL 1.308   Results for GENTRY, PILSON (MRN [id number]) as of 01/30/2012 18:06  Ref. Range 01/29/2012 18:00  RMSF IgG No range found 0.51  RMSF IgM Latest Range: 0.00-0.89 IV 0.98 (H)   Results for MARZELL, ISAKSON (MRN [id number]) as of 01/30/2012 18:06  Ref. Range 01/29/2012 23:30  RPR Latest Range: NON REACTIVE  NON REACTIVE  Mono Screen Latest Range: NEGATIVE  NEGATIVE  HIV Latest Range: NON REACTIVE  NON REACTIVE   Results for YUSIF, GNAU (MRN [id number]) as of 01/30/2012 18:06  Ref. Range 01/30/2012 00:59 01/30/2012 01:00 01/30/2012 11:30  Osmolality, Ur Latest Range: [phone] mOsm/kg 576    Sodium, Ur No range found   <10  Creatinine, Urine No range found   142.36   Micro Results: Recent Results (from the past 240 hour(s))  MRSA PCR SCREENING     Status: Normal   Collection Time   01/30/12 12:54 AM      Component Value Range Status Comment   MRSA by PCR NEGATIVE  NEGATIVE Final    Studies/Results: Dg Chest Port 1 View  01/29/2012  *RADIOLOGY REPORT*  Clinical Data: Cough.   Leukocytosis.  PORTABLE CHEST - 1 VIEW  Comparison: 06/05/2011  Findings: Negative for pneumonia or effusion.  Lungs are clear. Negative for heart failure.  IMPRESSION: No active cardiopulmonary disease.  Original Report Authenticated By: Camelia Phenes, M.D.   Medications:  Scheduled Meds:    . sodium chloride   Intravenous Once  . sodium chloride   Intravenous Once  . doxycycline (VIBRAMYCIN) IV  100 mg Intravenous BID  . famotidine  40 mg Oral Daily  . nicotine  21 mg Transdermal Daily  . pneumococcal 23 valent vaccine  0.5 mL Intramuscular Tomorrow-1000  . potassium chloride  40 mEq Oral Once  . sodium chloride  1,000 mL Intravenous Once  . DISCONTD: nicotine  14 mg Transdermal Daily   Continuous Infusions:    . 0.9 % sodium chloride with kcl 200 mL/hr at 01/30/12 1658  . DISCONTD: sodium chloride 200 mL/hr at 01/30/12 0100   PRN Meds:.acetaminophen, acetaminophen, alum & mag hydroxide-simeth, diphenhydrAMINE, ondansetron (ZOFRAN) IV, ondansetron   Assessment/Plan:  21 y.o male no significant PMH presents with nausea, vomiting, malaise, shortness of breath, fatigue, muscle aches, decreased urine output and heartburn.   1. Acute renal failure  -FENA indicating prerenal source -responding to aggressive hydration with improvement in urine output -Cr on admission was 4.02 trended down to 1.41 with IVF -will continue to monitor  2. Metabolic acidosis  -resolved -on admission AG was 29 (high) trended down to 10 wnl with IVF -see ABG above  3. Hyponatremia  -pt is assymtomatic  -Na trending up with IVF (127-->130)  4. Hypokalemia -replaced with 40 meq x 1, K this admission as low as 3.0  5. Generalized muscle weakness  could be related to h/o tick bites as IgM RMSF is slightly elevated and ck total 883  -will trend ck enzymes   6. Maculopapular rash, generalized  -Ddx could be related to h/o tick bites as IgM RMSF is slightly elevated; HIV/RPR/Mono all  negative -Continue Doxycycline 100 mg bid   7. Nausea & vomiting  -resolved   8. Chest pain -prob noncardiac 2/2 heartburn -trop neg x 1, EKG showed right atrial enlargement, RAD -added Maalox and Pepcid 40 mg qd   9. Tobacco abuse -smoking cessation ordered -pt desires to quit -Rx Nicotine patch  10. Urine drug screen  -+Amphetamines-pt denies recreational use admits taking one day prior to admission -Also + opiates as well  11. F/E/N -IVF decreased 200 cc/hr-->125 cc/hr -will monitor electrolytes prn -diet ordered   12. Dispo -Child psychotherapist consulted for needs -consider home tomorrow if still improving    LOS: 1 day   Annett Gula [phone] 01/30/2012, 6:02 PM

## 2012-01-31 ENCOUNTER — Encounter (HOSPITAL_COMMUNITY): Payer: Self-pay | Admitting: Internal Medicine

## 2012-01-31 DIAGNOSIS — R12 Heartburn: Secondary | ICD-10-CM

## 2012-01-31 DIAGNOSIS — Z720 Tobacco use: Secondary | ICD-10-CM | POA: Diagnosis present

## 2012-01-31 DIAGNOSIS — R05 Cough: Secondary | ICD-10-CM | POA: Diagnosis present

## 2012-01-31 DIAGNOSIS — R058 Other specified cough: Secondary | ICD-10-CM | POA: Diagnosis present

## 2012-01-31 DIAGNOSIS — R0981 Nasal congestion: Secondary | ICD-10-CM | POA: Diagnosis present

## 2012-01-31 LAB — CK TOTAL AND CKMB (NOT AT ARMC): CK, MB: 6.3 ng/mL (ref 0.3–4.0)

## 2012-01-31 LAB — CBC WITH DIFFERENTIAL/PLATELET
Eosinophils Absolute: 0 10*3/uL (ref 0.0–0.7)
Hemoglobin: 13.7 g/dL (ref 13.0–17.0)
Lymphocytes Relative: 27 % (ref 12–46)
Lymphs Abs: 2.6 10*3/uL (ref 0.7–4.0)
MCH: 29.8 pg (ref 26.0–34.0)
MCV: 83.7 fL (ref 78.0–100.0)
Monocytes Relative: 11 % (ref 3–12)
Neutrophils Relative %: 62 % (ref 43–77)
Platelets: 262 10*3/uL (ref 150–400)
RBC: 4.6 MIL/uL (ref 4.22–5.81)
WBC: 9.6 10*3/uL (ref 4.0–10.5)

## 2012-01-31 LAB — AMPHETAMINES URINE CONFIRMATION
Methylenedioxyamphetamine: NEGATIVE ng/mL
Methylenedioxyethylamphetamine: NEGATIVE ng/mL
Methylenedioxymethamphetamine: NEGATIVE ng/mL

## 2012-01-31 LAB — BASIC METABOLIC PANEL
BUN: 32 mg/dL — ABNORMAL HIGH (ref 6–23)
CO2: 26 mEq/L (ref 19–32)
GFR calc non Af Amer: >90 mL/min (ref >90)
Glucose, Bld: 102 mg/dL — ABNORMAL HIGH (ref 70–99)
Potassium: 4.2 mEq/L (ref 3.5–5.1)
Sodium: 138 mEq/L (ref 135–145)

## 2012-01-31 LAB — OPIATE, QUANTITATIVE, URINE
Codeine Urine: NEGATIVE ng/mL
Hydrocodone: NEGATIVE ng/mL
Hydromorphone GC/MS Conf: NEGATIVE ng/mL
Morphine, Confirm: 3505 ng/mL

## 2012-01-31 LAB — OSMOLALITY: Osmolality: 292 mOsm/kg (ref 275–300)

## 2012-01-31 MED ORDER — SALINE SPRAY 0.65 % NA SOLN: 1.0000 | NASAL | Status: DC | PRN

## 2012-01-31 MED ORDER — DOXYCYCLINE HYCLATE 100 MG PO TABS: 100.0000 mg | ORAL_TABLET | Freq: Two times a day (BID) | ORAL | Status: AC

## 2012-01-31 MED ORDER — DOXYCYCLINE HYCLATE 50 MG PO CAPS: 100.0000 mg | ORAL_CAPSULE | Freq: Two times a day (BID) | ORAL | Status: DC

## 2012-01-31 MED ORDER — NICOTINE 21 MG/24HR TD PT24: 1.0000 | MEDICATED_PATCH | Freq: Every day | TRANSDERMAL | Status: AC

## 2012-01-31 MED ORDER — SALINE SPRAY 0.65 % NA SOLN
1.0000 | NASAL | Status: DC | PRN
  Administered 2012-01-31: 1 via NASAL
  Filled 2012-01-31: qty 44

## 2012-01-31 MED ORDER — ALUM & MAG HYDROXIDE-SIMETH 200-200-20 MG/5ML PO SUSP: 30.0000 mL | ORAL | Status: AC | PRN

## 2012-01-31 NOTE — Significant Event (Signed)
Notified MD on-call of pt's critical CK MB of 6.8. No orders received; pt currently appears in no acute distress and is resting peacefully. Will continue to monitor.

## 2012-01-31 NOTE — Progress Notes (Addendum)
Subjective: Pt c/o nasal congestion, dry cough, heartburn is better, appetite is better, tolerating po, muscles aches this am that felt better yesterday.    Objective: Vital signs in last 24 hours: Filed Vitals:   01/30/12 2000 01/31/12 0000 01/31/12 0400 01/31/12 0405  BP:  117/71  122/77  Pulse:  75  91  Temp: 98.4 F (36.9 C) 98.2 F (36.8 C) 97.8 F (36.6 C)   TempSrc: Oral Oral Oral   Resp:  23    Height:      Weight:      SpO2:  95%  95%   Weight change:   Intake/Output Summary (Last 24 hours) at 01/31/12 0651 Last data filed at 01/31/12 0500  Gross per 24 hour  Intake 4596.67 ml  Output   2925 ml  Net 1671.67 ml   Vitals reviewed. General: resting in bed, awake, NAD HEENT: no scleral icterus Cardiac: RRR, no rubs, murmurs or gallops Pulm: clear to auscultation bilaterally, no wheezes, rales, or rhonchi Abd: soft, min TTP lower ab b/l, nondistended, BS present (normal) Ext: warm and well perfused, no pedal edema Neuro: alert and oriented X3 Skin: improved erythematous macular rash to trunk (mostly) and to lower ext   Lab Results: Basic Metabolic Panel:  Lab 01/31/12 0865 01/30/12 1349 01/29/12 2330  NA 138 130* --  K 4.2 3.3* --  CL 104 93* --  CO2 26 27 --  GLUCOSE 102* 101* --  BUN 32* 43* --  CREATININE 0.96 1.41* --  CALCIUM 8.9 9.1 --  MG 2.2 -- 2.7*  PHOS -- -- 6.9*   Liver Function Tests:  Lab 01/29/12 2203 01/29/12 1800  AST 22 34  ALT 29 37  ALKPHOS 82 97  BILITOT 0.5 0.6  PROT 9.0* 10.5*  ALBUMIN 4.9 5.9*    Lab 01/29/12 2330  LIPASE 26  AMYLASE --   CBC:  Lab 01/31/12 0415 01/30/12 0355 01/29/12 1800  WBC 9.6 16.0* --  NEUTROABS 5.9 -- 24.5*  HGB 13.7 16.9 --  HCT 38.5* 44.0 --  MCV 83.7 80.3 --  PLT 262 316 --   Cardiac Enzymes:  Lab 01/31/12 0415 01/30/12 0855 01/29/12 2005 01/29/12 1956  CKTOTAL 645* 883* -- 941*  CKMB 6.3* -- -- --  CKMBINDEX -- -- -- --  TROPONINI -- -- <0.30 --   D-Dimer:  Lab 01/29/12 2330   DDIMER 0.22   Hemoglobin A1C:  Lab 01/29/12 2330  HGBA1C 5.4   Thyroid Function Tests:  Lab 01/29/12 2330  TSH 1.308  T4TOTAL --  FREET4 --  T3FREE --  THYROIDAB --   Coagulation:  Lab 01/29/12 2330  LABPROT 15.1  INR 1.17   Urine Drug Screen: Drugs of Abuse     Component Value Date/Time   LABOPIA POSITIVE* 01/30/2012 0100   LABOPIA POSITIVE* 01/30/2012 0100   COCAINSCRNUR NEGATIVE 01/30/2012 0100   COCAINSCRNUR NONE DETECTED 01/30/2012 0100   LABBENZ NEGATIVE 01/30/2012 0100   LABBENZ NONE DETECTED 01/30/2012 0100   AMPHETMU POSITIVE* 01/30/2012 0100   AMPHETMU POSITIVE* 01/30/2012 0100   THCU NONE DETECTED 01/30/2012 0100   LABBARB NONE DETECTED 01/30/2012 0100    Urinalysis:  Lab 01/30/12 0100  COLORURINE YELLOW  LABSPEC 1.026  PHURINE 5.5  GLUCOSEU NEGATIVE  HGBUR TRACE*  BILIRUBINUR SMALL*  KETONESUR NEGATIVE  PROTEINUR 30*  UROBILINOGEN 0.2  NITRITE NEGATIVE  LEUKOCYTESUR NEGATIVE   Misc. Labs: CK total 883-->645  Results for HOPE, HOLST (MRN [id number]) as of 01/30/2012 18:06  Ref. Range  01/29/2012 18:00  RMSF IgG No range found 0.51  RMSF IgM Latest Range: 0.00-0.89 IV 0.98 (H)   Results for MONTARIUS, KITAGAWA (MRN [id number]) as of 01/30/2012 18:06  Ref. Range 01/29/2012 23:30  RPR Latest Range: NON REACTIVE  NON REACTIVE  Mono Screen Latest Range: NEGATIVE  NEGATIVE  HIV Latest Range: NON REACTIVE  NON REACTIVE   Results for JODIE, LEINER (MRN [id number]) as of 01/30/2012 18:06  Ref. Range 01/30/2012 00:59 01/30/2012 01:00 01/30/2012 11:30  Osmolality, Ur Latest Range: [phone] mOsm/kg 576    Sodium, Ur No range found   <10  Creatinine, Urine No range found   142.36   Micro Results: Recent Results (from the past 240 hour(s))  MRSA PCR SCREENING     Status: Normal   Collection Time   01/30/12 12:54 AM      Component Value Range Status Comment   MRSA by PCR NEGATIVE  NEGATIVE Final    Studies/Results: Dg Chest Port 1 View  01/29/2012  *RADIOLOGY  REPORT*  Clinical Data: Cough.  Leukocytosis.  PORTABLE CHEST - 1 VIEW  Comparison: 06/05/2011  Findings: Negative for pneumonia or effusion.  Lungs are clear. Negative for heart failure.  IMPRESSION: No active cardiopulmonary disease.  Original Report Authenticated By: Camelia Phenes, M.D.   Medications:  Scheduled Meds:    . doxycycline (VIBRAMYCIN) IV  100 mg Intravenous BID  . famotidine  40 mg Oral Daily  . nicotine  21 mg Transdermal Daily  . pneumococcal 23 valent vaccine  0.5 mL Intramuscular Tomorrow-1000  . potassium chloride  40 mEq Oral Once  . DISCONTD: nicotine  14 mg Transdermal Daily   Continuous Infusions:    . 0.9 % sodium chloride with kcl 125 mL/hr at 01/31/12 0351   PRN Meds:.acetaminophen, acetaminophen, alum & mag hydroxide-simeth, diphenhydrAMINE, ondansetron (ZOFRAN) IV, ondansetron   Assessment/Plan:  21 y.o male no significant PMH presents with nausea, vomiting, malaise, shortness of breath, fatigue, muscle aches, decreased urine output and heartburn.   1. Acute renal failure  -resolved -FENA indicating prerenal source -responding to aggressive hydration with improvement in urine output 775-->2.9 L mg/kg/hr -Cr on admission was 4.02 trended down to 0.96 with IVF  2. Metabolic acidosis  -resolved -on admission AG was 29 (high) trended down to 10 wnl with IVF  3. Hyponatremia  -resolved -Na trending up with IVF (127-->138)  4. Hypokalemia -resolved  5. Generalized muscle weakness  -could be related to h/o tick bites as IgM RMSF is slightly elevated and ck total 883  -Ck total trended down 883->645 responded to IVF  6. Maculopapular rash, generalized  -nonpruritic -Ddx could be related to h/o tick bites as IgM RMSF is slightly elevated; HIV/RPR/Mono all negative -Continue Doxycycline 100 mg bid po x 6 days total of 7 days tx  7. Nausea & vomiting  -resolved   8. Chest pain -prob noncardiac 2/2 heartburn -trop neg x 1, EKG showed right  atrial enlargement, RAD -Maalox and Pepcid 40 mg qd-Rx Maalox for home  9. Tobacco abuse -smoking cessation ordered -pt desires to quit outpatient -Rx Nicotine patch Rx home Rx  10. Urine drug screen  -+Amphetamines-pt denies recreational use admits taking one day prior to admission -Also + opiates as well  11. Nasal congestion -Rx saline spray   12. F/E/N -IVF 125 cc/hr -will monitor electrolytes prn -diet ordered   13. Dispo -case management consulted for med needs - home today v-OOB with assistance ok -[facility] f/u 02/12/12 at 2:15  pm   LOS: 2 days   [name] [phone] 01/31/2012, 6:51 AM

## 2012-01-31 NOTE — Care Management Note (Signed)
    Page 1 of 1   01/31/2012     2:33:58 PM   CARE MANAGEMENT NOTE 01/31/2012  Patient:  [name], [name]   Account Number:  [id number]  Date Initiated:  01/31/2012  Documentation initiated by:  Donn Pierini  Subjective/Objective Assessment:   Pt admitted with ARF     Action/Plan:   PTA pt lived at home, independent with ADLs   Anticipated DC Date:  01/31/2012   Anticipated DC Plan:  HOME/SELF CARE      DC Planning Services  CM consult  Medication Assistance      Choice offered to / List presented to:             Status of service:  Completed, signed off Medicare Important Message given?   (If response is "NO", the following Medicare IM given date fields will be blank) Date Medicare IM given:   Date Additional Medicare IM given:    Discharge Disposition:  HOME/SELF CARE  Per UR Regulation:  Reviewed for med. necessity/level of care/duration of stay  If discussed at Long Length of Stay Meetings, dates discussed:    Comments:  01/31/12- 1100- Donn Pierini RN, BSN [phone] Pt for potential d/c today, in to speak with pt at bedside- per conversation pt states he does not have a PCP in community- list of PCP resources given to pt including info on [employer]- pt states that he has transportation and uses [facility] for prescription needs. Pt reports that he feels that he can manage getting his meds as long as there are not too many and they are not costly. MD - we need to be sure that pt has scripts for generic medications $4 list if possible. Called main pharmacy and pt is eligible for indigent fund if needed to assist with meds that are not on $4 list. update 1200- Donn Pierini RN, BSN [phone] pt will need assistance with po antibx- sent prescription down to main pharmacy- pt aware to wait for med- and bedside RN to pick up med when ready at main pharmacy. per MD pt has been set up out [facility] outpt clinic with f/u appointment.

## 2012-01-31 NOTE — Progress Notes (Signed)
Clinical Child psychotherapist (CSW) received an inappropriate referral for medication assistance. CSW informed the IMTS team that referral would need to be for [facility]. CSW signing off.  Theresia Bough, MSW, Theresia Majors [phone]

## 2012-01-31 NOTE — Progress Notes (Signed)
Pt d/c today; VSS. Gave Rx to Pt at d/c that Case Mgt arranged. Reviewed f/up appt, d/c inst, et all with Pt. Pt indicated understanding.

## 2012-01-31 NOTE — Discharge Summary (Signed)
Internal Medicine Teaching [facility] Discharge Note  Name: [name] MRN: [id number] DOB: 16-Apr-1991 21 y.o.  Date of Admission: 01/29/2012  6:45 PM Date of Discharge: 01/31/2012 Attending Physician: Ulyess Mort, MD  Discharge Diagnosis: Active Problems:  Maculopapular rash, generalized  Generalized muscle weakness  Heartburn  Elevated CK  Nasal congestion  Dry cough  Tobacco abuse  Hypermagnesemia  Hyperphosphatemia Resolved ARF Resolved decreased urine output Resolved metabolic acidosis Resolved hyponatremia Resolved hypokalemia Resolved Nausea/Vomiting Resolved sob Resolved polycythemia Resolved  Leukocytosis   Discharge Medications: Medication List  As of 01/31/2012 12:19 PM   TAKE these medications         alum & mag hydroxide-simeth 200-200-20 MG/5ML suspension   Commonly known as: MAALOX/MYLANTA   Take 30 mLs by mouth as needed.      doxycycline 100 MG tablet   Commonly known as: VIBRA-TABS   Take 1 tablet (100 mg total) by mouth 2 (two) times daily.      nicotine 21 mg/24hr patch   Commonly known as: NICODERM CQ - dosed in mg/24 hours   Place 1 patch onto the skin daily.      sodium chloride 0.65 % Soln nasal spray   Commonly known as: OCEAN   Place 1 spray into the nose as needed for congestion.            Disposition and follow-up:   Mr.[name] was discharged from [facility] in good condition.  At the hospital follow up visit please address   1) Ask about muscle weakness may want to repeat CK total and BMET to make sure CK trending down and BMET is still wnl.  2) smoking cessation 3) No insurance-consider referral to [facility] for options   Follow-up Appointments:  Discharge Orders    Future Appointments: Provider: Department: Dept Phone: Center:   02/12/2012 2:15 PM Dede Query, MD [facility] [phone] [facility]     Future Orders Please Complete By Expires   Increase activity slowly      Discharge  instructions      Comments:   Please follow up with [facility] Internal Medicine 02/12/12 at 2:15 PM [phone]   Call MD for:  temperature >100.4      Call MD for:  persistant nausea and vomiting      Call MD for:  extreme fatigue         Consultations:none Procedures Performed:  Dg Chest Port 1 View  01/29/2012  *RADIOLOGY REPORT*  Clinical Data: Cough.  Leukocytosis.  PORTABLE CHEST - 1 VIEW  Comparison: 06/05/2011  Findings: Negative for pneumonia or effusion.  Lungs are clear. Negative for heart failure.  IMPRESSION: No active cardiopulmonary disease.  Original Report Authenticated By: Camelia Phenes, M.D.    Admission HPI:  21 y.o. M with no PMH, [facility] appendectomy. He presented to [facility] ED on 01/29/2012 with the complaints of nausea, vomiting, malaise, fatigue, and muscle cramps/aches.  The patient had a cold approximately 10 days ago, which resolved spontaneously approximately 4 days prior to admission (significant only for nasal congestion and cough). Following resolution of these cold symptoms, the patient states that he noticed a rash developing around umbilicus, which extended to cover the entire abdomen, as well as his chest, back, arms, and legs over the course of 24 hours, beginning approximately 3-4 days prior to admission. He states that the rash has been persistent since this time. He complains of mild pruritis, but no pain  associated with the lesions. He states he was seen in the ED at an OSH in [place] one day prior to admission, where he was given a three-day supply of diflucan and as he was not feeling quite as ill at that time was sent home without any further workup.  On the day of admission, he developed significant nausea and vomiting, as well as oliguria (possibly anuria, as patient states he might have possibly had some urine output earlier in the day but wasn't sure). He states that he had 8 episodes of emesis total, including one episode in the ED. He denies bloody or  bilious emesis (to note, emesis observed on exam was brown, but patient was drinking a coke and color was lighter than typically observed with hematemesis). Patient denies any changes in urine output prior to today.  Patient states [place] and spends a significant amount of time outdoors. He admits to multiple tick bites over the last few months , the most recent of which he noticed approximately 2 months prior to admission. He states that he kept the tick for 2 weeks in the event that he developed symptoms, but subsequently discarded it as he did not become ill. 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 He states that during the last few days he has become tired very quickly while out in the sun. Additionally, he states becoming very fatigued on changing a tire in the sun within the last couple of days, and states that this is usually an easy task for him to complete without needing to rest (had to rest 3 times during event).  He denies h/a, fever on admission.   Admission Physical Exam General Appearance:  Alert, cooperative, no distress, appears stated age   Head:  Normocephalic, without obvious abnormality, atraumatic, no ticks appreciated on cursory survey of scalp   Eyes:  PERRL, conjunctiva/corneas clear, EOM's intact, fundi  benign, both eyes   Nose:  Nares normal, septum midline, mucosa normal, no drainage or sinus tenderness   Throat:  Dry mucus membranes.   Neck:  Supple, symmetrical, trachea midline, no adenopathy;  thyroid: No enlargement/tenderness/nodules   Lungs:  Clear to auscultation bilaterally, respirations unlabored   Chest wall:  No tenderness or deformity   Heart:  Regular rate and rhythm, S1 and S2 normal, no murmur, rub or gallop   Abdomen:  Soft, diffusely tender, bowel sounds active all four quadrants,  no masses, no organomegaly   Extremities:  Extremities normal,  atraumatic, no cyanosis or edema   Pulses:  2+ and symmetric all extremities   Skin:  Maculopapular rash appreciated on trunk as well as all extremities. Rash is most concentrated on abdomen. In appearance, lesions are 1-69mm, maculopapular, with occasional pustulation; no ecchymoses, or other lesions appreciated      Neurologic:  Normal strength, sensation and reflexes  throughout     Hospital Course by problem list: Active Problems:  Maculopapular rash, generalized  Generalized muscle weakness  Heartburn  Elevated CK  Nasal congestion  Dry cough  Tobacco abuse  Hypermagnesemia  Hyperphosphatemia Resolved ARF Resolved decreased urine output Resolved metabolic acidosis Resolved hyponatremia Resolved hypokalemia Resolved Nausea/Vomiting Resolved sob Resolved polycythemia Resolved  Leukocytosis     21 y.o male no significant PMH presented with nausea, vomiting, malaise, shortness of breath, fatigue, muscle aches, decreased urine output and heartburn.  He had acute renal failure this admission with FENA indicating prerenal  source.  Creatinine on admission was 4.02 which trended down to 0.96 on day of discharge after aggressive IVF.  He had decreased urine output on admission which responded to IVF 200 cc/hr and on day of admission his urine output was <1L and by day of discharge urine output was 2.9 L over 24 hours.  By 24 hours his IVF was decreased to 125 cc/hr.  He had a metabolic acidosis on admission with an anion gap of 29 (high) which trended down to wnl by day of discharge.  ABG was obtained this admission (see above).  He had electrolyte disturbances this admission (i.e hyponatremia, hypokalemia, hyperphosphotemia, hypermagnesium) which corrected with IVF.  His Na trended up with IVF (127-->130) and his K was replaced orally and via IVF this admission with correction by discharge.  He had generalized muscle weakness and aches on admission which could be related to his history of  tick bites as IgM RMSF is slightly elevated.  His CK total was 883 on admission which trended down to 645.  He had a maculopapular rash, generalized.  The differential could be related to history of tick bites as IgM RMSF is slightly elevated. HIV/RPR/Mono were all negative.  He is to continue Doxycycline 100 mg bid x 6 more days for a total of 7 days.  It appears he responded well to Doxycycline 100 mg iv bid as the rash improved.  He had nausea & vomiting on admission which resolved. He had noncardiac chest pain this admission which is probably secondary to heartburn.  One set of troponins were negative.  An EKG showed right atrial enlargement, right axis deviation.  Repeat EKG showed prolonged QT on 8/13. 01/31/12 EKG was normal.  He was given Maalox and Pepcid 40 mg qd this admission with relief.  He has a history of tobacco abuse.  Smoking cessation was ordered this admission.  He desires to quit.  He was started on Nicotine 21 patch which will need to be tapered in the future per protocol.  This admission his urine drug screen was positive for amphetamines and opiates.  He denied recreational use and admitted to taking one adderral prior to admission to help with his symptoms.   Case management helped him be able to get the Rx Doxycycline 100 mg bid x 6 more days prior to discharge because he does not have insurance though he is working.      Discharge Vitals:  BP 126/70  Pulse 91  Temp 97.4 F (36.3 C) (Oral)  Resp 23  Ht 5\' 11"  (1.803 m)  Wt 236 lb 12.4 oz (107.4 kg)  BMI 33.02 kg/m2  SpO2 98%  Physical Exam Vitals reviewed.  General: resting in bed, awake, NAD  HEENT: no scleral icterus  Cardiac: RRR, no rubs, murmurs or gallops  Pulm: clear to auscultation bilaterally, no wheezes, rales, or rhonchi  Abd: soft, min TTP lower ab b/l, nondistended, BS present (normal)  Ext: warm and well perfused, no pedal edema  Neuro: alert and oriented X3  Skin: improved erythematous macular rash to  trunk (mostly) and to lower ext   Discharge Labs:  Results for orders placed during the hospital encounter of 01/29/12 (from the past 24 hour(s))  BASIC METABOLIC PANEL     Status: Abnormal   Collection Time   01/30/12  1:49 PM      Component Value Range   Sodium 130 (*) 135 - 145 mEq/L   Potassium 3.3 (*) 3.5 - 5.1 mEq/L  Chloride 93 (*) 96 - 112 mEq/L   CO2 27  19 - 32 mEq/L   Glucose, Bld 101 (*) 70 - 99 mg/dL   BUN 43 (*) 6 - 23 mg/dL   Creatinine, Ser 1.61 (*) 0.50 - 1.35 mg/dL   Calcium 9.1  8.4 - 09.6 mg/dL   GFR calc non Af Amer 70 (*) >90 mL/min   GFR calc Af Amer 81 (*) >90 mL/min  CK TOTAL AND CKMB     Status: Abnormal   Collection Time   01/31/12  4:15 AM      Component Value Range   Total CK 645 (*) 7 - 232 U/L   CK, MB 6.3 (*) 0.3 - 4.0 ng/mL   Relative Index 1.0  0.0 - 2.5  CBC WITH DIFFERENTIAL     Status: Abnormal   Collection Time   01/31/12  4:15 AM      Component Value Range   WBC 9.6  4.0 - 10.5 K/uL   RBC 4.60  4.22 - 5.81 MIL/uL   Hemoglobin 13.7  13.0 - 17.0 g/dL   HCT 04.5 (*) 40.9 - 81.1 %   MCV 83.7  78.0 - 100.0 fL   MCH 29.8  26.0 - 34.0 pg   MCHC 35.6  30.0 - 36.0 g/dL   RDW 91.4  78.2 - 95.6 %   Platelets 262  150 - 400 K/uL   Neutrophils Relative 62  43 - 77 %   Neutro Abs 5.9  1.7 - 7.7 K/uL   Lymphocytes Relative 27  12 - 46 %   Lymphs Abs 2.6  0.7 - 4.0 K/uL   Monocytes Relative 11  3 - 12 %   Monocytes Absolute 1.0  0.1 - 1.0 K/uL   Eosinophils Relative 0  0 - 5 %   Eosinophils Absolute 0.0  0.0 - 0.7 K/uL   Basophils Relative 0  0 - 1 %   Basophils Absolute 0.0  0.0 - 0.1 K/uL  BASIC METABOLIC PANEL     Status: Abnormal   Collection Time   01/31/12  4:15 AM      Component Value Range   Sodium 138  135 - 145 mEq/L   Potassium 4.2  3.5 - 5.1 mEq/L   Chloride 104  96 - 112 mEq/L   CO2 26  19 - 32 mEq/L   Glucose, Bld 102 (*) 70 - 99 mg/dL   BUN 32 (*) 6 - 23 mg/dL   Creatinine, Ser 2.13  0.50 - 1.35 mg/dL   Calcium 8.9  8.4 -  08.6 mg/dL   GFR calc non Af Amer >90  >90 mL/min   GFR calc Af Amer >90  >90 mL/min  MAGNESIUM     Status: Normal   Collection Time   01/31/12  4:15 AM      Component Value Range   Magnesium 2.2  1.5 - 2.5 mg/dL  OSMOLALITY     Status: Normal   Collection Time   01/31/12  4:15 AM      Component Value Range   Osmolality 292  275 - 300 mOsm/kg   Results for PERICLES, CARMICHEAL (MRN [id number]) as of 01/31/2012 12:11  Ref. Range 01/30/2012 00:00  Sample type No range found ARTERIAL  pH, Arterial Latest Range: 7.350-7.450  7.365  pCO2 arterial Latest Range: 35.0-45.0 mmHg 40.5  pO2, Arterial Latest Range: 80.0-100.0 mmHg 80.0  Bicarbonate Latest Range: 20.0-24.0 mEq/L 23.2  TCO2 Latest Range: 0-100 mmol/L 24  Acid-base deficit Latest Range: 0.0-2.0 mmol/L 2.0  O2 Saturation No range found 95.0  Patient temperature No range found 98.6 F  Collection site No range found RADIAL, ALLEN'S TEST ACCEPTABLE   Results for ANTONIE, BORJON (MRN [id number]) as of 01/31/2012 12:11  Ref. Range 01/29/2012 18:00 01/29/2012 19:56 01/29/2012 20:05 01/29/2012 22:03 01/29/2012 23:30 01/29/2012 23:31 01/30/2012 03:55 01/30/2012 08:55 01/30/2012 13:49 01/31/2012 04:15  Sodium Latest Range: 135-145 mEq/L 125 (L)   127 (L)   128 (L)  130 (L) 138  Potassium Latest Range: 3.5-5.1 mEq/L 3.7   3.7   3.0 (L)  3.3 (L) 4.2  Chloride Latest Range: 96-112 mEq/L 78 (L)   85 (L)   90 (L)  93 (L) 104  CO2 Latest Range: 19-32 mEq/L 18 (L)   19   24  27 26   Mean Plasma Glucose Latest Range: <117 mg/dL     725       BUN Latest Range: 6-23 mg/dL 69 (H)   69 (H)   58 (H)  43 (H) 32 (H)  Creatinine Latest Range: 0.50-1.35 mg/dL 3.66 (H)   4.40 (H)   3.47 (H)  1.41 (H) 0.96  Calcium Latest Range: 8.4-10.5 mg/dL 42.5 (H)   9.3   8.3 (L)  9.1 8.9  GFR calc non Af Amer Latest Range: >90 mL/min 17 (L)   20 (L)   40 (L)  70 (L) >90  GFR calc Af Amer Latest Range: >90 mL/min 19 (L)   23 (L)   46 (L)  81 (L) >90  Glucose Latest Range: 70-99 mg/dL 956 (H)    387 (H)   564 (H)  101 (H) 102 (H)  Phosphorus Latest Range: 2.3-4.6 mg/dL     6.9 (H)       Magnesium Latest Range: 1.5-2.5 mg/dL     2.7 (H)     2.2  Alkaline Phosphatase Latest Range: 39-117 U/L 97   82        Albumin Latest Range: 3.5-5.2 g/dL 5.9 (H)   4.9        Lipase Latest Range: 11-59 U/L     26       AST Latest Range: 0-37 U/L 34   22        ALT Latest Range: 0-53 U/L 37   29        Total Protein Latest Range: 6.0-8.3 g/dL 33.2 (H)   9.0 (H)        Total Bilirubin Latest Range: 0.3-1.2 mg/dL 0.6   0.5        CK, MB Latest Range: 0.3-4.0 ng/mL          6.3 (HH)  CK Total Latest Range: 7-232 U/L  941 (H)      883 (H)  645 (H)  LDH Latest Range: 94-250 U/L     196       Troponin I Latest Range: <0.30 ng/mL   <0.30         Lactic Acid, Venous Latest Range: 0.5-2.2 mmol/L      1.8      Osmolality Latest Range: 275-300 mOsm/kg          292   Results for AHMAR, PICKRELL (MRN [id number]) as of 01/31/2012 12:11  Ref. Range 01/29/2012 18:00 01/29/2012 22:03 01/29/2012 23:30 01/30/2012 03:55 01/31/2012 04:15  WBC Latest Range: 4.0-10.5 K/uL 27.3 (H) 20.4 (H)  16.0 (H) 9.6  RBC Latest Range: 4.22-5.81 MIL/uL 7.00 (H) 6.02 (H)  5.48 4.60  Hemoglobin Latest Range: 13.0-17.0 g/dL 19.1 (HH) 47.8 (H)  29.5 13.7  HCT Latest Range: 39.0-52.0 % 55.3 (H) 48.3  44.0 38.5 (L)  MCV Latest Range: 78.0-100.0 fL 79.0 80.2  80.3 83.7  MCH Latest Range: 26.0-34.0 pg 31.4 30.9  30.8 29.8  MCHC Latest Range: 30.0-36.0 g/dL 62.1 (H) 30.8 (H)  65.7 (H) 35.6  RDW Latest Range: 11.5-15.5 % 12.7 12.8  12.8 13.0  Platelets Latest Range: 150-400 K/uL 494 (H) 332  316 262  Neutrophils Relative Latest Range: 43-77 % 90 (H)    62  Lymphocytes Relative Latest Range: 12-46 % 5 (L)    27  Monocytes Relative Latest Range: 3-12 % 5    11  Eosinophils Relative Latest Range: 0-5 % 0    0  Basophils Relative Latest Range: 0-1 % 0    0  NEUT# Latest Range: 1.7-7.7 K/uL 24.5 (H)    5.9  Lymphocytes Absolute Latest Range: 0.7-4.0  K/uL 1.4    2.6  Monocytes Absolute Latest Range: 0.1-1.0 K/uL 1.4 (H)    1.0  Eosinophils Absolute Latest Range: 0.0-0.7 K/uL 0.0    0.0  Basophils Absolute Latest Range: 0.0-0.1 K/uL 0.0    0.0  WBC Morphology No range found TOXIC GRANULATION      Smear Review No range found LARGE PLATELETS PRESENT      Results for COLBURN, ASPER (MRN [id number]) as of 01/31/2012 12:11  Ref. Range 01/29/2012 23:30  D-Dimer, Quant Latest Range: 0.00-0.48 ug/mL-FEU 0.22  Prothrombin Time Latest Range: 11.6-15.2 seconds 15.1  INR Latest Range: 0.00-1.49  1.17  aPTT Latest Range: 24-37 seconds 43 (H)   Results for KYDAN, SHANHOLTZER (MRN [id number]) as of 01/31/2012 12:11  Ref. Range 01/30/2012 00:42 01/30/2012 01:00  Salicylate Lvl Latest Range: 2.8-20.0 mg/dL <0.2 (L)   MORPHINE CONFIRM Latest Range: Cutoff:50 ng/mL  3505  OXYCODONE Latest Range: Cutoff:50 ng/mL  NEGATIVE   Results for EH, SESAY (MRN [id number]) as of 01/31/2012 12:11  Ref. Range 01/29/2012 23:30 01/30/2012 03:55 01/30/2012 13:49 01/31/2012 04:15  Hemoglobin A1C Latest Range: <5.7 % 5.4     Glucose Latest Range: 70-99 mg/dL  347 (H) 425 (H) 956 (H)  TSH Latest Range: 0.350-4.500 uIU/mL 1.308      Results for JAVONNI, MACKE (MRN [id number]) as of 01/31/2012 12:11  Ref. Range 01/30/2012 01:00  CODEINE Latest Range: Cutoff:50 ng/mL NEGATIVE   Results for MAC, DOWDELL (MRN [id number]) as of 01/31/2012 12:11  Ref. Range 01/30/2012 00:56  DAT, complement No range found NEG  DAT, IgG No range found NEG   Results for ARON, INGE (MRN [id number]) as of 01/31/2012 12:11  Ref. Range 01/29/2012 18:00 01/29/2012 23:30  RPR Latest Range: NON REACTIVE   NON REACTIVE  Mono Screen Latest Range: NEGATIVE   NEGATIVE  RMSF IgG No range found 0.51   RMSF IgM Latest Range: 0.00-0.89 IV 0.98 (H)   HIV Latest Range: NON REACTIVE   NON REACTIVE   Results for WHITTEN, ANDREONI (MRN [id number]) as of 01/31/2012 12:11  Ref. Range 01/30/2012 00:59 01/30/2012 01:00 01/30/2012 11:30    Color, Urine Latest Range: YELLOW   YELLOW   APPearance Latest Range: CLEAR   CLOUDY (A)   Specific Gravity, Urine Latest Range: 1.005-1.030   1.026   pH Latest Range: 5.0-8.0   5.5   Glucose Latest Range: NEGATIVE mg/dL  NEGATIVE   Bilirubin Urine Latest Range: NEGATIVE   SMALL (A)   Ketones, ur Latest Range: NEGATIVE  mg/dL  NEGATIVE   Protein Latest Range: NEGATIVE mg/dL  30 (A)   Urobilinogen, UA Latest Range: 0.0-1.0 mg/dL  0.2   Nitrite Latest Range: NEGATIVE   NEGATIVE   Leukocytes, UA Latest Range: NEGATIVE   NEGATIVE   Hgb urine dipstick Latest Range: NEGATIVE   TRACE (A)   Urine-Other No range found  MUCOUS PRESENT   WBC, UA Latest Range: <3 WBC/hpf  0-2   RBC / HPF Latest Range: <3 RBC/hpf  0-2   Squamous Epithelial / LPF Latest Range: RARE   RARE   Bacteria, UA Latest Range: RARE   FEW (A)   Casts Latest Range: NEGATIVE   HYALINE CASTS (A)   Osmolality, Ur Latest Range: [phone] mOsm/kg 576    Sodium, Ur No range found   <10  Creatinine, Urine No range found   142.36  Creatinine,U No range found  265.9    Blood cultures NTD, MRSA PCR nares negative   Signed: Annett Gula 01/31/2012, 12:19 PM

## 2012-02-02 LAB — MYOGLOBIN, URINE: Myoglobin, Ur: <27 mcg/L (ref <28)

## 2012-02-05 LAB — CULTURE, BLOOD (ROUTINE X 2): Culture: NO GROWTH

## 2012-02-12 ENCOUNTER — Encounter: Payer: BC Managed Care – PPO | Admitting: Internal Medicine

## 2012-05-23 ENCOUNTER — Emergency Department (HOSPITAL_COMMUNITY): Payer: Self-pay

## 2012-05-23 ENCOUNTER — Emergency Department (HOSPITAL_COMMUNITY)
Admission: EM | Admit: 2012-05-23 | Discharge: 2012-05-23 | Disposition: A | Discharge status: Home / Self Care | Payer: Self-pay | Attending: Emergency Medicine | Admitting: Emergency Medicine

## 2012-05-23 ENCOUNTER — Encounter (HOSPITAL_COMMUNITY): Payer: Self-pay

## 2012-05-23 DIAGNOSIS — R109 Unspecified abdominal pain: Secondary | ICD-10-CM

## 2012-05-23 DIAGNOSIS — M25539 Pain in unspecified wrist: Secondary | ICD-10-CM | POA: Insufficient documentation

## 2012-05-23 DIAGNOSIS — M25531 Pain in right wrist: Secondary | ICD-10-CM

## 2012-05-23 DIAGNOSIS — R112 Nausea with vomiting, unspecified: Secondary | ICD-10-CM | POA: Insufficient documentation

## 2012-05-23 DIAGNOSIS — R209 Unspecified disturbances of skin sensation: Secondary | ICD-10-CM | POA: Insufficient documentation

## 2012-05-23 DIAGNOSIS — F17200 Nicotine dependence, unspecified, uncomplicated: Secondary | ICD-10-CM | POA: Insufficient documentation

## 2012-05-23 DIAGNOSIS — R110 Nausea: Secondary | ICD-10-CM | POA: Insufficient documentation

## 2012-05-23 DIAGNOSIS — Z87448 Personal history of other diseases of urinary system: Secondary | ICD-10-CM | POA: Insufficient documentation

## 2012-05-23 DIAGNOSIS — R1013 Epigastric pain: Secondary | ICD-10-CM | POA: Insufficient documentation

## 2012-05-23 LAB — CBC WITH DIFFERENTIAL/PLATELET
Basophils Absolute: 0 10*3/uL (ref 0.0–0.1)
Basophils Relative: 0 % (ref 0–1)
Eosinophils Absolute: 0.1 10*3/uL (ref 0.0–0.7)
MCH: 29.7 pg (ref 26.0–34.0)
MCHC: 35.4 g/dL (ref 30.0–36.0)
Monocytes Absolute: 0.6 10*3/uL (ref 0.1–1.0)
Neutro Abs: 6.2 10*3/uL (ref 1.7–7.7)
Neutrophils Relative %: 73 % (ref 43–77)
RDW: 12.4 % (ref 11.5–15.5)

## 2012-05-23 LAB — BASIC METABOLIC PANEL
BUN: 19 mg/dL (ref 6–23)
Chloride: 104 mEq/L (ref 96–112)
Creatinine, Ser: 0.9 mg/dL (ref 0.50–1.35)
GFR calc Af Amer: >90 mL/min (ref >90)
GFR calc non Af Amer: >90 mL/min (ref >90)
Potassium: 4.2 mEq/L (ref 3.5–5.1)

## 2012-05-23 LAB — LIPASE, BLOOD: Lipase: 32 U/L (ref 11–59)

## 2012-05-23 MED ORDER — HYDROCODONE-ACETAMINOPHEN 5-500 MG PO TABS: 1.0000 | ORAL_TABLET | Freq: Four times a day (QID) | ORAL | Status: DC | PRN

## 2012-05-23 MED ORDER — OXYCODONE-ACETAMINOPHEN 5-325 MG PO TABS
1.0000 | ORAL_TABLET | Freq: Once | ORAL | Status: AC
  Administered 2012-05-23: 1 via ORAL
  Filled 2012-05-23: qty 1

## 2012-05-23 MED ORDER — FAMOTIDINE 20 MG PO TABS: 20.0000 mg | ORAL_TABLET | Freq: Two times a day (BID) | ORAL | Status: DC

## 2012-05-23 MED ORDER — ONDANSETRON 4 MG PO TBDP
4.0000 mg | ORAL_TABLET | Freq: Once | ORAL | Status: AC
  Administered 2012-05-23: 4 mg via ORAL
  Filled 2012-05-23: qty 1

## 2012-05-23 MED ORDER — PANTOPRAZOLE SODIUM 20 MG PO TBEC: 20.0000 mg | DELAYED_RELEASE_TABLET | Freq: Every day | ORAL | Status: DC

## 2012-05-23 NOTE — ED Provider Notes (Signed)
Medical screening examination/treatment/procedure(s) were performed by non-physician practitioner and as supervising physician I was immediately available for consultation/collaboration.  Derwood Kaplan, MD 05/23/12 1546

## 2012-05-23 NOTE — ED Notes (Signed)
Patient provided sprite and graham crackers.

## 2012-05-23 NOTE — Progress Notes (Signed)
Orthopedic Tech Progress Note Patient Details:  [name] 27-Sep-1990 [id number]  Ortho Devices Type of Ortho Device: Wrist splint Ortho Device/Splint Location: RIGHT WRIST SPLINT Ortho Device/Splint Interventions: Application   Cammer, Mickie Bail 05/23/2012, 2:50 PM

## 2012-05-23 NOTE — ED Notes (Signed)
Pt presents with 3 day h/o vomiting and epigastric pain.  Pt reports today, he has been able to keep crackers and water down, reports 1 episode of blood-tinged emesis.  Pt denies any diarrhea at present.  Pt also reports 3 week h/o R wrist pain.  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  Pt reports intermittent swelling to wrist.

## 2012-05-23 NOTE — ED Provider Notes (Signed)
History     CSN: [id number]  Arrival date & time 05/23/12  1123   First MD Initiated Contact with Patient 05/23/12 1425      No chief complaint on file.   (Consider location/radiation/quality/duration/timing/severity/associated sxs/prior treatment) HPI  21 year old male presents with multiple complaints. Patient reports for the past 3 weeks he has had increase pain to his right wrist. Pain is described as a throbbing sensation , non radiating,  worsening with picking up objects and performing his work related activity, improves with rest.  C/o tingling sensation in fingers without numbness.  Denies specific trauma.  Is RHD.  Has stried ibuprofen and tylenol without adequate relief.  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  Pt also reports intermittent bouts of vomiting x 3 days.  Endorse epigastric pain, nausea, vomit <5 bouts daily with one bloody emesis (2-3 table spoon amount) once 3 days ago but none since.  Still feel nauseated but has not had any recent vomits. He did notice soft stool but no significant diarrhea. Denies cough, moderate assist, melena, hematochezia.  He did admits to taking ibuprofen for his wrist pain.  Denies any significant alcohol use or recreational drug use  Past Medical History  Diagnosis Date  . AKI (acute kidney injury)     01/2012 resolved    Past Surgical History  Procedure Date  . Appendectomy 2007    ruptured     Family History  Problem Relation Age of Onset  . Hypertension Father     History  Substance Use Topics  . Smoking status: Current Every Day Smoker -- 0.5 packs/day for 8 years    Types: Cigarettes  . Smokeless tobacco: Current User    Types: Chew  . Alcohol Use: 0.0 oz/week    1-2 Cans of beer per week      Review of Systems  All other systems reviewed and are negative.    Allergies  Review of patient's allergies indicates no known allergies.  Home Medications   Current Outpatient Rx  Name  Route  Sig   Dispense  Refill  . ACETAMINOPHEN 500 MG PO TABS   Oral   Take 1,000-2,000 mg by mouth every 6 (six) hours as needed. For pain         . IBUPROFEN 200 MG PO TABS   Oral   Take 800-1,200 mg by mouth every 6 (six) hours as needed. For pain           BP 136/67  Pulse 68  Temp 97.5 F (36.4 C) (Oral)  Resp 17  SpO2 99%  Physical Exam  Nursing note and vitals reviewed. Constitutional: He appears well-developed and well-nourished. No distress.  HENT:  Head: Atraumatic.  Mouth/Throat: Oropharynx is clear and moist.  Eyes: Conjunctivae normal are normal.  Neck: Normal range of motion. Neck supple.  Cardiovascular: Normal rate and regular rhythm.  Exam reveals no gallop and no friction rub.   No murmur heard. Pulmonary/Chest: Effort normal and breath sounds normal. No respiratory distress. He exhibits no tenderness.  Abdominal: Soft. He exhibits no distension. There is tenderness (Mild epigastric tenderness without guarding or rebound tenderness. No hernia noted. No overlying skin changes.). There is no rebound and no guarding.  Musculoskeletal: He exhibits tenderness (right wrist with tenderness diffusely, increased with wrist flexion and extension. no overlying skin changes, no edema noted. Radial pulse 2+. Normal grip strength. Brisk cap refills to all distal fingers.). He exhibits no edema.    ED Course  Procedures (  including critical care time)   Labs Reviewed  CBC WITH DIFFERENTIAL  BASIC METABOLIC PANEL  LIPASE, BLOOD   Dg Wrist Complete Right  05/23/2012  *RADIOLOGY REPORT*  Clinical Data: Pain and swelling  RIGHT WRIST - COMPLETE 3+ VIEW  Comparison: None.  Findings: Frontal, oblique, lateral, and ulnar deviation scaphoid images were obtained.  No fracture or dislocation.  Joint spaces appear intact.  No erosive change.  IMPRESSION: No abnormality noted.   Original Report Authenticated By: Bretta Bang, M.D.      No diagnosis found.  Results for orders placed  during the hospital encounter of 05/23/12  CBC WITH DIFFERENTIAL      Component Value Range   WBC 8.5  4.0 - 10.5 K/uL   RBC 5.09  4.22 - 5.81 MIL/uL   Hemoglobin 15.1  13.0 - 17.0 g/dL   HCT 82.9  56.2 - 13.0 %   MCV 83.9  78.0 - 100.0 fL   MCH 29.7  26.0 - 34.0 pg   MCHC 35.4  30.0 - 36.0 g/dL   RDW 86.5  78.4 - 69.6 %   Platelets 257  150 - 400 K/uL   Neutrophils Relative 73  43 - 77 %   Neutro Abs 6.2  1.7 - 7.7 K/uL   Lymphocytes Relative 18  12 - 46 %   Lymphs Abs 1.6  0.7 - 4.0 K/uL   Monocytes Relative 7  3 - 12 %   Monocytes Absolute 0.6  0.1 - 1.0 K/uL   Eosinophils Relative 1  0 - 5 %   Eosinophils Absolute 0.1  0.0 - 0.7 K/uL   Basophils Relative 0  0 - 1 %   Basophils Absolute 0.0  0.0 - 0.1 K/uL  BASIC METABOLIC PANEL      Component Value Range   Sodium 140  135 - 145 mEq/L   Potassium 4.2  3.5 - 5.1 mEq/L   Chloride 104  96 - 112 mEq/L   CO2 28  19 - 32 mEq/L   Glucose, Bld 78  70 - 99 mg/dL   BUN 19  6 - 23 mg/dL   Creatinine, Ser 2.95  0.50 - 1.35 mg/dL   Calcium 9.4  8.4 - 28.4 mg/dL   GFR calc non Af Amer >90  >90 mL/min   GFR calc Af Amer >90  >90 mL/min  LIPASE, BLOOD      Component Value Range   Lipase 32  11 - 59 U/L   Dg Wrist Complete Right  05/23/2012  *RADIOLOGY REPORT*  Clinical Data: Pain and swelling  RIGHT WRIST - COMPLETE 3+ VIEW  Comparison: None.  Findings: Frontal, oblique, lateral, and ulnar deviation scaphoid images were obtained.  No fracture or dislocation.  Joint spaces appear intact.  No erosive change.  IMPRESSION: No abnormality noted.   Original Report Authenticated By: Bretta Bang, M.D.     1. R wrist pain 2. Abdominal pain   MDM  Patient presents with right wrist tenderness. X-ray shows no evidence of fractures or dislocation. Pain is suggestive of arthritis. Wrist brace, right therapy, and pain medication given.  Patient also complaining of epigastric tenderness and having one bouts of bloody emesis. He has been  taken nonsteroidal anti-inflammatory medication for wrist pain, which I suspect may have caused his bloody emesis.  Abdomen non surgical.  Hemoccult negative. Labs are unremarkable.  Recommend avoiding NSAIDs.  Will give H2 and PPI as well.  Ortho referral given.    BP 136/67  Pulse 68  Temp 97.5 F (36.4 C) (Oral)  Resp 17  SpO2 99%  3:18 PM Pt able to tolerates PO.  Pain controlled, will d/c.    I have reviewed nursing notes and vital signs. I personally reviewed the imaging tests through PACS system  I reviewed available ER/hospitalization records thought the EMR       Fayrene Helper, [place] 05/23/12 1519

## 2012-05-23 NOTE — ED Notes (Signed)
No answer x1

## 2016-02-20 ENCOUNTER — Observation Stay
Admission: EM | Admit: 2016-02-20 | Discharge: 2016-02-21 | Disposition: A | Discharge status: Home / Self Care | Payer: Self-pay | Attending: Internal Medicine | Admitting: Internal Medicine

## 2016-02-20 ENCOUNTER — Emergency Department: Payer: Self-pay

## 2016-02-20 ENCOUNTER — Encounter: Payer: Self-pay | Admitting: Emergency Medicine

## 2016-02-20 DIAGNOSIS — K219 Gastro-esophageal reflux disease without esophagitis: Secondary | ICD-10-CM | POA: Insufficient documentation

## 2016-02-20 DIAGNOSIS — L0390 Cellulitis, unspecified: Secondary | ICD-10-CM

## 2016-02-20 DIAGNOSIS — L03115 Cellulitis of right lower limb: Principal | ICD-10-CM | POA: Insufficient documentation

## 2016-02-20 DIAGNOSIS — Z79891 Long term (current) use of opiate analgesic: Secondary | ICD-10-CM | POA: Insufficient documentation

## 2016-02-20 DIAGNOSIS — L03119 Cellulitis of unspecified part of limb: Secondary | ICD-10-CM | POA: Diagnosis present

## 2016-02-20 DIAGNOSIS — Z8249 Family history of ischemic heart disease and other diseases of the circulatory system: Secondary | ICD-10-CM | POA: Insufficient documentation

## 2016-02-20 DIAGNOSIS — T148XXA Other injury of unspecified body region, initial encounter: Secondary | ICD-10-CM

## 2016-02-20 DIAGNOSIS — Z9049 Acquired absence of other specified parts of digestive tract: Secondary | ICD-10-CM | POA: Insufficient documentation

## 2016-02-20 DIAGNOSIS — L089 Local infection of the skin and subcutaneous tissue, unspecified: Secondary | ICD-10-CM

## 2016-02-20 DIAGNOSIS — F17210 Nicotine dependence, cigarettes, uncomplicated: Secondary | ICD-10-CM | POA: Insufficient documentation

## 2016-02-20 LAB — COMPREHENSIVE METABOLIC PANEL
ALBUMIN: 4.9 g/dL (ref 3.5–5.0)
ALT: 30 U/L (ref 17–63)
AST: 29 U/L (ref 15–41)
Alkaline Phosphatase: 57 U/L (ref 38–126)
Anion gap: 8 (ref 5–15)
BUN: 13 mg/dL (ref 6–20)
CHLORIDE: 101 mmol/L (ref 101–111)
CO2: 29 mmol/L (ref 22–32)
Calcium: 9.7 mg/dL (ref 8.9–10.3)
Creatinine, Ser: 0.83 mg/dL (ref 0.61–1.24)
GFR calc Af Amer: >60 mL/min (ref >60)
GFR calc non Af Amer: >60 mL/min (ref >60)
GLUCOSE: 82 mg/dL (ref 65–99)
POTASSIUM: 3.9 mmol/L (ref 3.5–5.1)
SODIUM: 138 mmol/L (ref 135–145)
Total Bilirubin: 0.7 mg/dL (ref 0.3–1.2)
Total Protein: 8.9 g/dL — ABNORMAL HIGH (ref 6.5–8.1)

## 2016-02-20 LAB — CBC WITH DIFFERENTIAL/PLATELET
Basophils Absolute: 0.1 10*3/uL (ref 0–0.1)
Basophils Relative: 1 %
Eosinophils Absolute: 0.1 10*3/uL (ref 0–0.7)
Eosinophils Relative: 1 %
HCT: 47.5 % (ref 40.0–52.0)
Hemoglobin: 16.4 g/dL (ref 13.0–18.0)
Lymphocytes Relative: 14 %
Lymphs Abs: 1.8 10*3/uL (ref 1.0–3.6)
MCH: 29.9 pg (ref 26.0–34.0)
MCHC: 34.4 g/dL (ref 32.0–36.0)
MCV: 87 fL (ref 80.0–100.0)
Monocytes Absolute: 0.9 10*3/uL (ref 0.2–1.0)
Monocytes Relative: 7 %
Neutro Abs: 10.5 10*3/uL — ABNORMAL HIGH (ref 1.4–6.5)
Neutrophils Relative %: 77 %
Platelets: 267 10*3/uL (ref 150–440)
RBC: 5.47 MIL/uL (ref 4.40–5.90)
RDW: 13.5 % (ref 11.5–14.5)
WBC: 13.5 10*3/uL — ABNORMAL HIGH (ref 3.8–10.6)

## 2016-02-20 MED ORDER — ONDANSETRON HCL 4 MG PO TABS: 4.0000 mg | ORAL_TABLET | Freq: Four times a day (QID) | ORAL | Status: DC | PRN

## 2016-02-20 MED ORDER — SODIUM CHLORIDE 0.9 % IV SOLN
INTRAVENOUS | Status: DC
  Administered 2016-02-20: 23:00:00 via INTRAVENOUS

## 2016-02-20 MED ORDER — CEFAZOLIN IN D5W 1 GM/50ML IV SOLN
1.0000 g | Freq: Once | INTRAVENOUS | Status: AC
  Administered 2016-02-20: 1 g via INTRAVENOUS
  Filled 2016-02-20: qty 50

## 2016-02-20 MED ORDER — VANCOMYCIN HCL IN DEXTROSE 1-5 GM/200ML-% IV SOLN
1000.0000 mg | Freq: Once | INTRAVENOUS | Status: AC
  Administered 2016-02-20: 1000 mg via INTRAVENOUS
  Filled 2016-02-20: qty 200

## 2016-02-20 MED ORDER — ZOLPIDEM TARTRATE 5 MG PO TABS: 5.0000 mg | ORAL_TABLET | Freq: Every evening | ORAL | Status: DC | PRN

## 2016-02-20 MED ORDER — ONDANSETRON HCL 4 MG/2ML IJ SOLN
4.0000 mg | Freq: Once | INTRAMUSCULAR | Status: AC
  Administered 2016-02-20: 4 mg via INTRAVENOUS

## 2016-02-20 MED ORDER — HYDROCODONE-ACETAMINOPHEN 5-325 MG PO TABS
1.0000 | ORAL_TABLET | ORAL | Status: DC | PRN
  Administered 2016-02-21: 2 via ORAL
  Filled 2016-02-20: qty 2

## 2016-02-20 MED ORDER — KETOROLAC TROMETHAMINE 30 MG/ML IJ SOLN
30.0000 mg | Freq: Four times a day (QID) | INTRAMUSCULAR | Status: DC | PRN
  Administered 2016-02-20: 30 mg via INTRAVENOUS
  Filled 2016-02-20: qty 1

## 2016-02-20 MED ORDER — SENNOSIDES-DOCUSATE SODIUM 8.6-50 MG PO TABS: 1.0000 | ORAL_TABLET | Freq: Every evening | ORAL | Status: DC | PRN

## 2016-02-20 MED ORDER — BISACODYL 5 MG PO TBEC: 5.0000 mg | DELAYED_RELEASE_TABLET | Freq: Every day | ORAL | Status: DC | PRN

## 2016-02-20 MED ORDER — MAGNESIUM CITRATE PO SOLN: 1.0000 | Freq: Once | ORAL | Status: DC | PRN

## 2016-02-20 MED ORDER — TETANUS-DIPHTH-ACELL PERTUSSIS 5-2.5-18.5 LF-MCG/0.5 IM SUSP
0.5000 mL | Freq: Once | INTRAMUSCULAR | Status: AC
  Administered 2016-02-20: 0.5 mL via INTRAMUSCULAR
  Filled 2016-02-20: qty 0.5

## 2016-02-20 MED ORDER — ACETAMINOPHEN 650 MG RE SUPP: 650.0000 mg | Freq: Four times a day (QID) | RECTAL | Status: DC | PRN

## 2016-02-20 MED ORDER — ONDANSETRON HCL 4 MG/2ML IJ SOLN: 4.0000 mg | Freq: Four times a day (QID) | INTRAMUSCULAR | Status: DC | PRN

## 2016-02-20 MED ORDER — ACETAMINOPHEN 325 MG PO TABS: 650.0000 mg | ORAL_TABLET | Freq: Four times a day (QID) | ORAL | Status: DC | PRN

## 2016-02-20 MED ORDER — MORPHINE SULFATE (PF) 4 MG/ML IV SOLN
4.0000 mg | Freq: Once | INTRAVENOUS | Status: AC
  Administered 2016-02-20: 4 mg via INTRAVENOUS

## 2016-02-20 MED ORDER — VANCOMYCIN HCL 10 G IV SOLR
1250.0000 mg | Freq: Three times a day (TID) | INTRAVENOUS | Status: DC
  Administered 2016-02-21 (×2): 1250 mg via INTRAVENOUS
  Filled 2016-02-20 (×4): qty 1250

## 2016-02-20 MED ORDER — VANCOMYCIN HCL IN DEXTROSE 1-5 GM/200ML-% IV SOLN: 1000.0000 mg | Freq: Once | INTRAVENOUS | Status: DC

## 2016-02-20 NOTE — Progress Notes (Signed)
Pharmacy Antibiotic Note  [name] is a 25 y.o. male admitted on 02/20/2016 with wound infection.  Pharmacy has been consulted for vancomycin dosing.  Plan: Vancomycin 1 gm IV x 1 in ED followed in approximately 6 hours (stacked dosing) by vancomycin 1.25 gm IV Q8H (maximum initial dose per [facility] policy). Predicted trough 11 mcg/mL. Pharmacy will continue to follow and adjust as needed to maintain trough 10 to 15 mcg/mL.   Vd 60.2 L, Ke 0.14 hr-1, T1/2 4.9 hr  Height: 5\' 11"  (180.3 cm) Weight: 225 lb (102.1 kg) IBW/kg (Calculated) : 75.3  Temp (24hrs), Avg:98.3 F (36.8 C), Min:98.3 F (36.8 C), Max:98.3 F (36.8 C)   Recent Labs Lab 02/20/16 1733  WBC 13.5*  CREATININE 0.83    Estimated Creatinine Clearance: 164.1 mL/min (by C-G formula based on SCr of 0.83 mg/dL).    No Known Allergies  Thank you for allowing pharmacy to be a part of this patient's care.  Laural Benes, Pharm.D., BCPS Clinical Pharmacist 02/20/2016 10:57 PM

## 2016-02-20 NOTE — ED Triage Notes (Signed)
Pt presents to ED c/o right foot infection related to walking and "shoe was too tight; rubbed against top of foot". Pt presents with right leg edema; open wound on foot oozing, red, hot to touch.

## 2016-02-20 NOTE — ED Provider Notes (Signed)
[facility] Emergency Department Provider Note ____________________________________________   First MD Initiated Contact with Patient 02/20/16 1924     (approximate)  I have reviewed the triage vital signs and the nursing notes.   HISTORY  Chief Complaint Wound Infection    HPI [name] is a 25 y.o. male with no significant past medical history presents for evaluation of a wound on the top of his right foot.  He reports that 2 days ago he was walking through the woods and his boots and sock were wet.  He thinks it rubbed off the skin on the top of his foot.  He treated it by rubbing hand sanitizer into the wound, covering it with Vaseline, and then wrapping of the wound tightly for the next 2 days.  His pain is gotten gradually worse and that right foot and it is spreading up past his ankle and to his right lower leg.  He has some swelling of the foot and redness that is extending up past the ankle as well.  The wound has been draining pus since he took off the bandage.  He reports rigors but has not measured a temperature.  He is currently shivering in bed under a blanket in the ED.  He denies nausea, vomiting, chest pain, shortness of breath, abdominal pain, dysuria.  Movement makes the pain in the foot worse and nothing makes it better.He does not remember the date of his last tetanus shot but thinks it was at least 10 years ago   Past Medical History:  Diagnosis Date  . AKI (acute kidney injury) ([place])    01/2012 resolved    Patient Active Problem List   Diagnosis Date Noted  . Cellulitis of foot 02/20/2016  . Nasal congestion 01/31/2012  . Dry cough 01/31/2012  . Tobacco abuse 01/31/2012  . Heartburn 01/30/2012  . Maculopapular rash, generalized 01/29/2012  . Generalized muscle weakness 01/29/2012  . Elevated CK 01/29/2012  . Hypermagnesemia 01/29/2012  . Hyperphosphatemia 01/29/2012    Past Surgical History:  Procedure Laterality Date  .  APPENDECTOMY  2007   ruptured     Prior to Admission medications   Medication Sig Start Date End Date Taking? Authorizing Provider  famotidine (PEPCID) 20 MG tablet Take 1 tablet (20 mg total) by mouth 2 (two) times daily. 05/23/12   Domenic Moras, PA-C  HYDROcodone-acetaminophen (VICODIN) 5-500 MG per tablet Take 1-2 tablets by mouth every 6 (six) hours as needed for pain. 05/23/12   Domenic Moras, PA-C  pantoprazole (PROTONIX) 20 MG tablet Take 1 tablet (20 mg total) by mouth daily. 05/23/12   Domenic Moras, PA-C    Allergies Review of patient's allergies indicates no known allergies.  Family History  Problem Relation Age of Onset  . Hypertension Father     Social History Social History  Substance Use Topics  . Smoking status: Current Every Day Smoker    Packs/day: 0.50    Years: 8.00    Types: Cigarettes  . Smokeless tobacco: Current User    Types: Chew  . Alcohol use 0.0 oz/week    1 - 2 Cans of beer per week    Review of Systems Constitutional: +Rigors Eyes: No visual changes. ENT: No sore throat. Cardiovascular: Denies chest pain. Respiratory: Denies shortness of breath. Gastrointestinal: No abdominal pain.  No nausea, no vomiting.  No diarrhea.  No constipation. Genitourinary: Negative for dysuria. Musculoskeletal: Negative for back pain. Skin: Pain, redness, and swelling with a wound on  the top of his right foot and redness and swelling extending up his shin Neurological: Negative for headaches, focal weakness or numbness.  10-point ROS otherwise negative.  ____________________________________________   PHYSICAL EXAM:  VITAL SIGNS: ED Triage Vitals  Enc Vitals Group     BP 02/20/16 1723 (!) 146/80     Pulse Rate 02/20/16 1723 92     Resp 02/20/16 1723 18     Temp 02/20/16 1723 98.3 F (36.8 C)     Temp Source 02/20/16 1723 Oral     SpO2 02/20/16 1723 98 %     Weight 02/20/16 1724 225 lb (102.1 kg)     Height 02/20/16 1724 5\' 11"  (1.803 m)     Head  Circumference --      Peak Flow --      Pain Score 02/20/16 1724 10     Pain Loc --      Pain Edu? --      Excl. in [place]? --     Constitutional: Alert and oriented. Appears uncomfortable and is shivering but is joking with me Eyes: Conjunctivae are normal. PERRL. EOMI. Head: Atraumatic. Nose: No congestion/rhinnorhea. Mouth/Throat: Mucous membranes are moist.  Oropharynx non-erythematous. Neck: No stridor.  No meningeal signs.   Cardiovascular: Normal rate, regular rhythm. Good peripheral circulation. Grossly normal heart sounds. Respiratory: Normal respiratory effort.  No retractions. Lungs CTAB. Gastrointestinal: Soft and nontender. No distention.  Musculoskeletal: No lower extremity tenderness nor edema. No gross deformities of extremities. See Skin exam. Neurologic:  Normal speech and language. No gross focal neurologic deficits are appreciated.  Skin:  Generally warm and dry.  The patient has a large wound on the top of his right foot that is draining some purulent material.  There is surrounding cellulitis that involves the entire foot and there is some redness that is streaking up past the ankle into his right lower leg.  The right lower leg is probably more warm to the touch than the left lower leg.  The foot itself feels hot.  There is tenderness to palpation throughout the right lower leg and foot. Psychiatric: Mood and affect are normal. Speech and behavior are normal.  ____________________________________________   LABS (all labs ordered are listed, but only abnormal results are displayed)  Labs Reviewed  COMPREHENSIVE METABOLIC PANEL - Abnormal; Notable for the following:       Result Value   Total Protein 8.9 (*)    All other components within normal limits  CBC WITH DIFFERENTIAL/PLATELET - Abnormal; Notable for the following:    WBC 13.5 (*)    Neutro Abs 10.5 (*)    All other components within normal limits  URINE CULTURE  CULTURE, BLOOD (ROUTINE X 2)  CULTURE,  BLOOD (ROUTINE X 2)  AEROBIC/ANAEROBIC CULTURE (SURGICAL/DEEP WOUND)  URINALYSIS COMPLETEWITH MICROSCOPIC ([facility] ONLY)   ____________________________________________  EKG  None - EKG not ordered by ED physician ____________________________________________  RADIOLOGY   Dg Foot 2 Views Right  Result Date: 02/20/2016 CLINICAL DATA:  Infected area at the3rd through 5th metatarsals after walking 6 miles on 02/18/2016, redness, swelling, cellulitis/wound infection EXAM: RIGHT FOOT - 2 VIEW COMPARISON:  Non FINDINGS: Soft tissue swelling throughout the RIGHT foot. Skin irregularity/ ulcerated dorsum of midfoot overlying the proximal metatarsals. Osseous mineralization normal. Joint spaces preserved. No acute fracture, dislocation, or bone destruction. IMPRESSION: Soft tissue swelling without acute osseous abnormalities. Electronically Signed   By: Lavonia Dana M.D.   On: 02/20/2016 20:16    ____________________________________________  PROCEDURES  Procedure(s) performed:   Procedures   Critical Care performed: No ____________________________________________   INITIAL IMPRESSION / ASSESSMENT AND PLAN / ED COURSE  Pertinent labs & imaging results that were available during my care of the patient were reviewed by me and considered in my medical decision making (see chart for details).  The patient does not meet sepsis criteria but he does have a leukocytosis and a rapidly worsening cellulitis of his right lower leg.  There is no evidence of necrotizing fasciitis but I am concerned that if we do not treat aggressively with inpatient treatment with IV antibiotics that his infection will get significantly worse given how much it is spread and less than 2 full days.  I treated him with Ancef and vancomycin as per [facility] antibiotic order set recommendations and will admit for further management.  I also gave a tetanus vaccination   Clinical Course  Value Comment By Time  DG Foot 2  Views Right No evidence of osteomyelitis or subcutaneous gas concerning for necrotizing fasciitis Hinda Kehr, MD 09/03 2033    ____________________________________________  FINAL CLINICAL IMPRESSION(S) / ED DIAGNOSES  Final diagnoses:  Cellulitis  Wound infection ([place])  Cellulitis of right foot     MEDICATIONS GIVEN DURING THIS VISIT:  Medications  ceFAZolin (ANCEF) IVPB 1 g/50 mL premix (0 g Intravenous Stopped 02/20/16 2035)  vancomycin (VANCOCIN) IVPB 1000 mg/200 mL premix (0 mg Intravenous Stopped 02/20/16 2144)  Tdap (BOOSTRIX) injection 0.5 mL (0.5 mLs Intramuscular Given 02/20/16 2004)  ondansetron (ZOFRAN) injection 4 mg (4 mg Intravenous Given 02/20/16 2039)  morphine 4 MG/ML injection 4 mg (4 mg Intravenous Given 02/20/16 2040)     NEW OUTPATIENT MEDICATIONS STARTED DURING THIS VISIT:  New Prescriptions   No medications on file    Modified Medications   No medications on file    Discontinued Medications   No medications on file     Note:  This document was prepared using Dragon voice recognition software and may include unintentional dictation errors.    Hinda Kehr, MD 02/20/16 2229

## 2016-02-20 NOTE — ED Notes (Signed)
Report called to Tracy, RN

## 2016-02-21 LAB — CBC
HCT: 39.5 % — ABNORMAL LOW (ref 40.0–52.0)
Hemoglobin: 13.9 g/dL (ref 13.0–18.0)
MCH: 30.6 pg (ref 26.0–34.0)
MCHC: 35.3 g/dL (ref 32.0–36.0)
MCV: 86.8 fL (ref 80.0–100.0)
PLATELETS: 232 10*3/uL (ref 150–440)
RBC: 4.56 MIL/uL (ref 4.40–5.90)
RDW: 13.2 % (ref 11.5–14.5)
WBC: 9 10*3/uL (ref 3.8–10.6)

## 2016-02-21 MED ORDER — TRAMADOL-ACETAMINOPHEN 37.5-325 MG PO TABS: 1.0000 | ORAL_TABLET | Freq: Four times a day (QID) | ORAL | 0 refills | Status: DC | PRN

## 2016-02-21 MED ORDER — CLINDAMYCIN HCL 300 MG PO CAPS: 300.0000 mg | ORAL_CAPSULE | Freq: Three times a day (TID) | ORAL | 0 refills | Status: DC

## 2016-02-21 MED ORDER — CEFAZOLIN IN D5W 1 GM/50ML IV SOLN
1.0000 g | Freq: Three times a day (TID) | INTRAVENOUS | Status: DC
Start: 2016-02-21 — End: 2016-02-21
  Administered 2016-02-21: 1 g via INTRAVENOUS
  Filled 2016-02-21 (×3): qty 50

## 2016-02-21 NOTE — H&P (Signed)
[place] [facility] Admission History and Physical Harvie Bridge, D.O.  ---------------------------------------------------------------------------------------------------------------------   PATIENT NAME: [name] MR#: [id number] DATE OF BIRTH: 03-05-1991 DATE OF ADMISSION: 02/20/2016 PRIMARY CARE PHYSICIAN: Default, Provider, MD  REQUESTING/REFERRING PHYSICIAN: ED Dr. Karma Greaser  CHIEF COMPLAINT: Chief Complaint  Patient presents with  . Wound Infection    HISTORY OF PRESENT ILLNESS: Waino Lessa is a 25 y.o. male with Remote history of reflux who states that he walked about 6 miles 2 days ago with wet socks and shoes. He subsequently developed a wound on the top of his right foot which he treated with hand sanitizer, Vaseline and wrapped his wound in Saran wrap. He reports since then he has developed swelling, redness, pain in the top of the right foot swelling spreading up to his ankle and lower right leg. The wound has been draining pus. He also complains of fevers and chills. He denies any trauma.  Otherwise there has been no change in status. Patient has been taking medication as prescribed and there has been no recent change in medication or diet.  There has been no recent illness, travel or sick contacts.    Patient denies weakness, dizziness, chest pain, shortness of breath, N/V/C/D, abdominal pain, dysuria/frequency, changes in mental status.   EMS/ED COURSE:   Patient received vancomycin and Ancef  PAST MEDICAL HISTORY: Past Medical History:  Diagnosis Date  . AKI (acute kidney injury) ([place])    01/2012 resolved      PAST SURGICAL HISTORY: Past Surgical History:  Procedure Laterality Date  . APPENDECTOMY  2007   ruptured       SOCIAL HISTORY: Social History  Substance Use Topics  . Smoking status: Current Every Day Smoker    Packs/day: 0.50    Years: 8.00    Types: Cigarettes  . Smokeless tobacco: Current User    Types: Chew  . Alcohol  use 0.0 oz/week    1 - 2 Cans of beer per week      FAMILY HISTORY: Family History  Problem Relation Age of Onset  . Hypertension Father      MEDICATIONS AT HOME: Prior to Admission medications   Medication Sig Start Date End Date Taking? Authorizing Provider  famotidine (PEPCID) 20 MG tablet Take 1 tablet (20 mg total) by mouth 2 (two) times daily. 05/23/12   Domenic Moras, PA-C  HYDROcodone-acetaminophen (VICODIN) 5-500 MG per tablet Take 1-2 tablets by mouth every 6 (six) hours as needed for pain. 05/23/12   Domenic Moras, PA-C  pantoprazole (PROTONIX) 20 MG tablet Take 1 tablet (20 mg total) by mouth daily. 05/23/12   Domenic Moras, PA-C  Of note patient states that he does not take Pepcid or Protonix as listed in his med reconciliation.    DRUG ALLERGIES: No Known Allergies   REVIEW OF SYSTEMS: CONSTITUTIONAL: Positive fever or chills. No fatigue, weakness, weight gain/loss, headache EYES: No blurry or double vision. ENT: No tinnitus, postnasal drip, redness or soreness of the oropharynx. RESPIRATORY: No cough, wheeze, hemoptysis, dyspnea. CARDIOVASCULAR: No chest pain, orthopnea, palpitations, syncope. GASTROINTESTINAL: No nausea, vomiting, constipation, diarrhea, abdominal pain, hematemesis, melena or hematochezia. GENITOURINARY: No dysuria or hematuria. ENDOCRINE: No polyuria or nocturia. No heat or cold intolerance. HEMATOLOGY: No anemia, bruising, bleeding. INTEGUMENTARY: No rashes, ulcers, lesions. Positive wound right dorsal foot MUSCULOSKELETAL: No arthritis, swelling, gout. NEUROLOGIC: No numbness, tingling, weakness or ataxia. No seizure-type activity. PSYCHIATRIC: No anxiety, depression, insomnia.  PHYSICAL EXAMINATION: VITAL SIGNS: Blood pressure (!) 114/57, pulse  83, temperature 98.3 F (36.8 C), temperature source Oral, resp. rate 16, height 5\' 11"  (1.803 m), weight 102.1 kg (225 lb), SpO2 98 %.  GENERAL: 25 y.o.-year-old white male patient, disheveled but  well-developed, well-nourished lying in the bed in no acute distress.  Pleasant and cooperative.   HEENT: Head atraumatic, normocephalic. Pupils equal, round, reactive to light and accommodation. No scleral icterus. Extraocular muscles intact. Nares are patent. Oropharynx is clear. Mucus membranes moist. NECK: Supple, full range of motion. No JVD, no bruit heard. No thyroid enlargement, no tenderness, no cervical lymphadenopathy. CHEST: Normal breath sounds bilaterally. No wheezing, rales, rhonchi or crackles. No use of accessory muscles of respiration.  No reproducible chest wall tenderness.  CARDIOVASCULAR: S1, S2 normal. No murmurs, rubs, or gallops. Cap refill <2 seconds. ABDOMEN: Soft, nontender, nondistended. No rebound, guarding, rigidity. Normoactive bowel sounds present in all four quadrants. No organomegaly or mass. EXTREMITIES: Full range of motion. No pedal edema, cyanosis, or clubbing. NEUROLOGIC: Cranial nerves II through XII are grossly intact with no focal sensorimotor deficit. Muscle strength 5/5 in all extremities. Sensation intact. Gait not checked. PSYCHIATRIC: The patient is alert and oriented x 3. Normal affect, mood, thought content. SKIN: Warm, dry, and intact without obvious rash, lesion, or ulcer with the exception of a 3 cm wound in the top of his right foot with some central clearing filled with greenish to yellow purulent discharge. The wound is surrounded by erythema that extends to the ankle and is streaking up into the right lower leg anterior and posteriorly. There is some limitation of range of motion of the ankle as well as the fourth toe. As to palpation over the foot and toes.  LABORATORY PANEL:  CBC  Recent Labs Lab 02/20/16 1733  WBC 13.5*  HGB 16.4  HCT 47.5  PLT 267   ----------------------------------------------------------------------------------------------------------------- Chemistries  Recent Labs Lab 02/20/16 1733  NA 138  K 3.9  CL 101   CO2 29  GLUCOSE 82  BUN 13  CREATININE 0.83  CALCIUM 9.7  AST 29  ALT 30  ALKPHOS 57  BILITOT 0.7   ------------------------------------------------------------------------------------------------------------------ Cardiac Enzymes No results for input(s): TROPONINI in the last 168 hours. ------------------------------------------------------------------------------------------------------------------  RADIOLOGY: Dg Foot 2 Views Right  Result Date: 02/20/2016 CLINICAL DATA:  Infected area at the3rd through 5th metatarsals after walking 6 miles on 02/18/2016, redness, swelling, cellulitis/wound infection EXAM: RIGHT FOOT - 2 VIEW COMPARISON:  Non FINDINGS: Soft tissue swelling throughout the RIGHT foot. Skin irregularity/ ulcerated dorsum of midfoot overlying the proximal metatarsals. Osseous mineralization normal. Joint spaces preserved. No acute fracture, dislocation, or bone destruction. IMPRESSION: Soft tissue swelling without acute osseous abnormalities. Electronically Signed   By: Lavonia Dana M.D.   On: 02/20/2016 20:16    IMPRESSION AND PLAN:  This is a 25 y.o. male with a history of GERD now being admitted with: 1. Cellulitis of the right foot. Although patient is not meeting sepsis criteria, the severity of the wound and rapid progression of his symptoms warrant admission for observation, IV antibiotics with vancomycin and Ancef. Wound cultures have been taken in the emergency department. Pain control will be ordered.  Diet/Nutrition: Regular Fluids: IV normal saline DVT Px: SCDs and early ambulation Code Status: Full  All the records are reviewed and case discussed with ED provider. Management plans discussed with the patient and/or family who express understanding and agree with plan of care.   TOTAL TIME TAKING CARE OF THIS PATIENT: 45 minutes.   [employer] D.O.  on 02/21/2016 at 12:44 AM Between 7am to 6pm - Pager - [phone] After 6pm go to [email or web address] -  Proofreader [facility] Office [phone] CC: Primary care physician; Default, Provider, MD     Note: This dictation was prepared with Dragon dictation along with smaller phrase technology. Any transcriptional errors that result from this process are unintentional.

## 2016-02-21 NOTE — Care Management (Signed)
Spoke with patient for discharge plan. 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 Patient is able to drive and care for self. Reviewed discharge medications and he stated the he can afford the $34 dollars for his discharge meds. Gave patient list of [facility] clinics. Patient will not qualify for [facility] or [facility].  No other CM needs needed.

## 2016-02-21 NOTE — Progress Notes (Signed)
Patient refused to be seen by wound care nurse that will be on campus in afternoon, wanted to leave by lunch as he was told by MD. Paged wound nurse several times without response. Reviewed discharge summary with verbal understanding. RXs given upon discharge. VSS at this time. Escorted OOF via staff per wc.

## 2016-02-21 NOTE — Discharge Instructions (Signed)
Regular diet. Activity as tolerated. Follow up Wound care clinic. Smoking cessation.

## 2016-02-21 NOTE — Discharge Summary (Signed)
[place] [facility] [place] NAME: [name]    MR#:  [id number]  DATE OF BIRTH:  [birth year]  DATE OF ADMISSION:  02/20/2016   ADMITTING PHYSICIAN: Harvie Bridge, DO  DATE OF DISCHARGE: 02/21/2016 PRIMARY CARE PHYSICIAN: Default, Provider, MD   ADMISSION DIAGNOSIS:  Cellulitis [L03.90] Wound infection ([place]) [T14.8, L08.9] Cellulitis of right foot [id number] DISCHARGE DIAGNOSIS:  Active Problems:   Cellulitis of foot  SECONDARY DIAGNOSIS:   Past Medical History:  Diagnosis Date  . AKI (acute kidney injury) ([place])    01/2012 resolved   HOSPITAL COURSE:   This is a 25 y.o. male with a history of GERD now being admitted with: 1. Cellulitis of the right foot. He is treated with IV antibiotics with vancomycin and Ancef. Change to clindamycin po for 12 days, Wound care. F/u Wound cultures as outpatient.  2. Tobacco abuse. Smoking cessation was counseled for 3 min. Right ankle and foot redness and welling are better. DISCHARGE CONDITIONS:  Stable, discharge to home today. CONSULTS OBTAINED:   DRUG ALLERGIES:  No Known Allergies DISCHARGE MEDICATIONS:     Medication List    STOP taking these medications   HYDROcodone-acetaminophen 5-500 MG tablet Commonly known as:  VICODIN   pantoprazole 20 MG tablet Commonly known as:  PROTONIX     TAKE these medications   clindamycin 300 MG capsule Commonly known as:  CLEOCIN Take 1 capsule (300 mg total) by mouth 3 (three) times daily.   famotidine 20 MG tablet Commonly known as:  PEPCID Take 1 tablet (20 mg total) by mouth 2 (two) times daily.   traMADol-acetaminophen 37.5-325 MG tablet Commonly known as:  ULTRACET Take 1 tablet by mouth every 6 (six) hours as needed for moderate pain or severe pain.        DISCHARGE INSTRUCTIONS:   DIET:  Regular. DISCHARGE CONDITION:  Stable. ACTIVITY:  As tolerated. If you experience worsening of your admission symptoms, develop  shortness of breath, life threatening emergency, suicidal or homicidal thoughts you must seek medical attention immediately by calling 911 or calling your MD immediately  if symptoms less severe.  You Must read complete instructions/literature along with all the possible adverse reactions/side effects for all the Medicines you take and that have been prescribed to you. Take any new Medicines after you have completely understood and accpet all the possible adverse reactions/side effects.   Please note  You were cared for by a hospitalist during your hospital stay. If you have any questions about your discharge medications or the care you received while you were in the hospital after you are discharged, you can call the unit and asked to speak with the hospitalist on call if the hospitalist that took care of you is not available. Once you are discharged, your primary care physician will handle any further medical issues. Please note that NO REFILLS for any discharge medications will be authorized once you are discharged, as it is imperative that you return to your primary care physician (or establish a relationship with a primary care physician if you do not have one) for your aftercare needs so that they can reassess your need for medications and monitor your lab values.    On the day of Discharge:  VITAL SIGNS:  Blood pressure 137/74, pulse 62, temperature 97.9 F (36.6 C), temperature source Oral, resp. rate 18, height 5\' 11"  (1.803 m), weight 225 lb (102.1 kg), SpO2 100 %. PHYSICAL EXAMINATION:  GENERAL:  24  y.o.-year-old patient lying in the bed with no acute distress.  EYES: Pupils equal, round, reactive to light and accommodation. No scleral icterus. Extraocular muscles intact.  HEENT: Head atraumatic, normocephalic. Oropharynx and nasopharynx clear.  NECK:  Supple, no jugular venous distention. No thyroid enlargement, no tenderness.  LUNGS: Normal breath sounds bilaterally, no wheezing,  rales,rhonchi or crepitation. No use of accessory muscles of respiration.  CARDIOVASCULAR: S1, S2 normal. No murmurs, rubs, or gallops.  ABDOMEN: Soft, non-tender, non-distended. Bowel sounds present. No organomegaly or mass.  EXTREMITIES: No pedal edema, cyanosis, or clubbing.  NEUROLOGIC: Cranial nerves II through XII are intact. Muscle strength 5/5 in all extremities. Sensation intact. Gait not checked.  PSYCHIATRIC: The patient is alert and oriented x 3.  SKIN: No obvious rash, there is a 3 cm wound in the top of his right foot with some central clearing filled with greenish to yellow purulent discharge. The wound is surrounded by mild erythema. Right foot and ankle swelling. DATA REVIEW:   CBC  Recent Labs Lab 02/21/16 0333  WBC 9.0  HGB 13.9  HCT 39.5*  PLT 232    Chemistries   Recent Labs Lab 02/20/16 1733  NA 138  K 3.9  CL 101  CO2 29  GLUCOSE 82  BUN 13  CREATININE 0.83  CALCIUM 9.7  AST 29  ALT 30  ALKPHOS 21  BILITOT 0.7     Microbiology Results  Results for orders placed or performed during the hospital encounter of 02/20/16  Culture, blood (Routine x 2)     Status: None (Preliminary result)   Collection Time: 02/20/16  5:33 PM  Result Value Ref Range Status   Specimen Description BLOOD RIGHT ANTECUBITAL  Final   Special Requests   Final    BOTTLES DRAWN AEROBIC AND ANAEROBIC  AER 15CC ANA 10CC   Culture NO GROWTH < 12 HOURS  Final   Report Status PENDING  Incomplete  Aerobic/Anaerobic Culture (surgical/deep wound)     Status: None (Preliminary result)   Collection Time: 02/20/16  8:13 PM  Result Value Ref Range Status   Specimen Description FOOT RIGHT  Final   Special Requests NONE  Final   Gram Stain   Final    RARE WBC PRESENT, PREDOMINANTLY MONONUCLEAR MODERATE GRAM POSITIVE COCCI IN CLUSTERS IN PAIRS Performed at [facility]    Culture PENDING  Incomplete   Report Status PENDING  Incomplete  Culture, blood (Routine x 2)      Status: None (Preliminary result)   Collection Time: 02/21/16  3:33 AM  Result Value Ref Range Status   Specimen Description BLOOD PERIPHERAL  Final   Special Requests   Final    BOTTLES DRAWN AEROBIC AND ANAEROBIC 10CCAERO,6CCANA   Culture NO GROWTH < 12 HOURS  Final   Report Status PENDING  Incomplete    RADIOLOGY:  Dg Foot 2 Views Right  Result Date: 02/20/2016 CLINICAL DATA:  Infected area at the3rd through 5th metatarsals after walking 6 miles on 02/18/2016, redness, swelling, cellulitis/wound infection EXAM: RIGHT FOOT - 2 VIEW COMPARISON:  Non FINDINGS: Soft tissue swelling throughout the RIGHT foot. Skin irregularity/ ulcerated dorsum of midfoot overlying the proximal metatarsals. Osseous mineralization normal. Joint spaces preserved. No acute fracture, dislocation, or bone destruction. IMPRESSION: Soft tissue swelling without acute osseous abnormalities. Electronically Signed   By: Lavonia Dana M.D.   On: 02/20/2016 20:16     Management plans discussed with the patient, family and they are in agreement.  CODE STATUS:  Code Status Orders        Start     Ordered   02/20/16 2240  Full code  Continuous     02/20/16 2239    Code Status History    Date Active Date Inactive Code Status Order ID Comments User Context   This patient has a current code status but no historical code status.      TOTAL TIME TAKING CARE OF THIS PATIENT: 32 minutes.    Demetrios Loll M.D on 02/21/2016 at 12:29 PM  Between 7am to 6pm - Pager - [phone]  After 6pm go to [email or web address] - Proofreader  [facility]  Office  [phone]  CC: Primary care physician; Default, Provider, MD   Note: This dictation was prepared with Dragon dictation along with smaller phrase technology. Any transcriptional errors that result from this process are unintentional.

## 2016-02-25 LAB — CULTURE, BLOOD (ROUTINE X 2): Culture: NO GROWTH

## 2016-02-26 LAB — AEROBIC/ANAEROBIC CULTURE W GRAM STAIN (SURGICAL/DEEP WOUND)

## 2016-02-26 LAB — CULTURE, BLOOD (ROUTINE X 2): CULTURE: NO GROWTH

## 2016-02-26 LAB — AEROBIC/ANAEROBIC CULTURE (SURGICAL/DEEP WOUND)

## 2019-06-08 ENCOUNTER — Other Ambulatory Visit: Payer: Self-pay

## 2019-06-08 ENCOUNTER — Emergency Department (HOSPITAL_COMMUNITY): Payer: Self-pay

## 2019-06-08 ENCOUNTER — Emergency Department (HOSPITAL_COMMUNITY)
Admission: EM | Admit: 2019-06-08 | Discharge: 2019-06-08 | Disposition: A | Discharge status: Home / Self Care | Payer: Self-pay | Attending: Emergency Medicine | Admitting: Emergency Medicine

## 2019-06-08 ENCOUNTER — Encounter (HOSPITAL_COMMUNITY): Payer: Self-pay | Admitting: Emergency Medicine

## 2019-06-08 DIAGNOSIS — Y999 Unspecified external cause status: Secondary | ICD-10-CM | POA: Insufficient documentation

## 2019-06-08 DIAGNOSIS — F17210 Nicotine dependence, cigarettes, uncomplicated: Secondary | ICD-10-CM | POA: Insufficient documentation

## 2019-06-08 DIAGNOSIS — F1290 Cannabis use, unspecified, uncomplicated: Secondary | ICD-10-CM | POA: Insufficient documentation

## 2019-06-08 DIAGNOSIS — M25552 Pain in left hip: Secondary | ICD-10-CM | POA: Insufficient documentation

## 2019-06-08 DIAGNOSIS — Y929 Unspecified place or not applicable: Secondary | ICD-10-CM | POA: Insufficient documentation

## 2019-06-08 DIAGNOSIS — W1830XA Fall on same level, unspecified, initial encounter: Secondary | ICD-10-CM | POA: Insufficient documentation

## 2019-06-08 DIAGNOSIS — M25562 Pain in left knee: Secondary | ICD-10-CM | POA: Insufficient documentation

## 2019-06-08 DIAGNOSIS — Y939 Activity, unspecified: Secondary | ICD-10-CM | POA: Insufficient documentation

## 2019-06-08 MED ORDER — HYDROCODONE-ACETAMINOPHEN 5-325 MG PO TABS
1.0000 | ORAL_TABLET | Freq: Once | ORAL | Status: AC
  Administered 2019-06-08: 1 via ORAL
  Filled 2019-06-08: qty 1

## 2019-06-08 MED ORDER — MORPHINE SULFATE (PF) 4 MG/ML IV SOLN
4.0000 mg | Freq: Once | INTRAVENOUS | Status: AC
  Administered 2019-06-08: 4 mg via INTRAMUSCULAR
  Filled 2019-06-08: qty 1

## 2019-06-08 MED ORDER — CYCLOBENZAPRINE HCL 10 MG PO TABS: 10.0000 mg | ORAL_TABLET | Freq: Two times a day (BID) | ORAL | 0 refills | Status: DC | PRN

## 2019-06-08 MED ORDER — CYCLOBENZAPRINE HCL 10 MG PO TABS
5.0000 mg | ORAL_TABLET | Freq: Once | ORAL | Status: AC
  Administered 2019-06-08: 5 mg via ORAL
  Filled 2019-06-08: qty 1

## 2019-06-08 MED ORDER — IBUPROFEN 400 MG PO TABS
600.0000 mg | ORAL_TABLET | Freq: Once | ORAL | Status: AC
  Administered 2019-06-08: 600 mg via ORAL
  Filled 2019-06-08: qty 1

## 2019-06-08 NOTE — ED Notes (Signed)
Patient verbalizes understanding of discharge instructions. Opportunity for questioning and answers were provided. Armband removed by staff, pt discharged from ED.  
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
## 2021-10-01 ENCOUNTER — Emergency Department: Payer: Self-pay

## 2021-10-01 ENCOUNTER — Encounter: Payer: Self-pay | Admitting: Emergency Medicine

## 2021-10-01 ENCOUNTER — Other Ambulatory Visit: Payer: Self-pay

## 2021-10-01 ENCOUNTER — Inpatient Hospital Stay
Admission: EM | Admit: 2021-10-01 | Discharge: 2021-10-02 | DRG: 918 | Discharge status: Against Medical Advice | Payer: Self-pay | Attending: Family Medicine | Admitting: Family Medicine

## 2021-10-01 DIAGNOSIS — Z5329 Procedure and treatment not carried out because of patient's decision for other reasons: Secondary | ICD-10-CM | POA: Diagnosis not present

## 2021-10-01 DIAGNOSIS — F1090 Alcohol use, unspecified, uncomplicated: Secondary | ICD-10-CM | POA: Diagnosis present

## 2021-10-01 DIAGNOSIS — R451 Restlessness and agitation: Secondary | ICD-10-CM | POA: Diagnosis present

## 2021-10-01 DIAGNOSIS — R7401 Elevation of levels of liver transaminase levels: Secondary | ICD-10-CM | POA: Diagnosis present

## 2021-10-01 DIAGNOSIS — T796XXD Traumatic ischemia of muscle, subsequent encounter: Secondary | ICD-10-CM

## 2021-10-01 DIAGNOSIS — F1210 Cannabis abuse, uncomplicated: Secondary | ICD-10-CM | POA: Diagnosis present

## 2021-10-01 DIAGNOSIS — M6282 Rhabdomyolysis: Secondary | ICD-10-CM | POA: Diagnosis present

## 2021-10-01 DIAGNOSIS — R404 Transient alteration of awareness: Secondary | ICD-10-CM

## 2021-10-01 DIAGNOSIS — T43601A Poisoning by unspecified psychostimulants, accidental (unintentional), initial encounter: Principal | ICD-10-CM

## 2021-10-01 DIAGNOSIS — R4182 Altered mental status, unspecified: Secondary | ICD-10-CM

## 2021-10-01 DIAGNOSIS — Z8249 Family history of ischemic heart disease and other diseases of the circulatory system: Secondary | ICD-10-CM

## 2021-10-01 DIAGNOSIS — T50901A Poisoning by unspecified drugs, medicaments and biological substances, accidental (unintentional), initial encounter: Principal | ICD-10-CM | POA: Diagnosis present

## 2021-10-01 DIAGNOSIS — E876 Hypokalemia: Secondary | ICD-10-CM | POA: Diagnosis present

## 2021-10-01 DIAGNOSIS — F17210 Nicotine dependence, cigarettes, uncomplicated: Secondary | ICD-10-CM | POA: Diagnosis present

## 2021-10-01 HISTORY — DX: Rhabdomyolysis: M62.82

## 2021-10-01 HISTORY — DX: Altered mental status, unspecified: R41.82

## 2021-10-01 LAB — BLOOD GAS, ARTERIAL
Acid-base deficit: 0.6 mmol/L (ref 0.0–2.0)
Bicarbonate: 24.9 mmol/L (ref 20.0–28.0)
O2 Content: 2 L/min
O2 Saturation: 99.7 %
Patient temperature: 37
pCO2 arterial: 43 mmHg (ref 32–48)
pH, Arterial: 7.37 (ref 7.35–7.45)
pO2, Arterial: 129 mmHg — ABNORMAL HIGH (ref 83–108)

## 2021-10-01 LAB — COMPREHENSIVE METABOLIC PANEL
ALT: 47 U/L — ABNORMAL HIGH (ref 0–44)
AST: 98 U/L — ABNORMAL HIGH (ref 15–41)
Albumin: 4.1 g/dL (ref 3.5–5.0)
Alkaline Phosphatase: 53 U/L (ref 38–126)
Anion gap: 8 (ref 5–15)
BUN: 28 mg/dL — ABNORMAL HIGH (ref 6–20)
CO2: 25 mmol/L (ref 22–32)
Calcium: 8.8 mg/dL — ABNORMAL LOW (ref 8.9–10.3)
Chloride: 104 mmol/L (ref 98–111)
Creatinine, Ser: 1.15 mg/dL (ref 0.61–1.24)
GFR, Estimated: >60 mL/min (ref >60)
Glucose, Bld: 90 mg/dL (ref 70–99)
Potassium: 3.7 mmol/L (ref 3.5–5.1)
Sodium: 137 mmol/L (ref 135–145)
Total Bilirubin: 0.8 mg/dL (ref 0.3–1.2)
Total Protein: 7.2 g/dL (ref 6.5–8.1)

## 2021-10-01 LAB — URINALYSIS, COMPLETE (UACMP) WITH MICROSCOPIC
Bilirubin Urine: NEGATIVE
Glucose, UA: NEGATIVE mg/dL
Hgb urine dipstick: NEGATIVE
Ketones, ur: 20 mg/dL — AB
Leukocytes,Ua: NEGATIVE
Nitrite: NEGATIVE
Protein, ur: NEGATIVE mg/dL
Specific Gravity, Urine: 1.028 (ref 1.005–1.030)
Squamous Epithelial / HPF: NONE SEEN (ref 0–5)
pH: 5 (ref 5.0–8.0)

## 2021-10-01 LAB — CK: Total CK: 4501 U/L — ABNORMAL HIGH (ref 49–397)

## 2021-10-01 LAB — URINE DRUG SCREEN, QUALITATIVE (ARMC ONLY)
Amphetamines, Ur Screen: POSITIVE — AB
Barbiturates, Ur Screen: NOT DETECTED
Benzodiazepine, Ur Scrn: POSITIVE — AB
Cannabinoid 50 Ng, Ur ~~LOC~~: NOT DETECTED
Cocaine Metabolite,Ur ~~LOC~~: NOT DETECTED
MDMA (Ecstasy)Ur Screen: NOT DETECTED
Methadone Scn, Ur: NOT DETECTED
Opiate, Ur Screen: POSITIVE — AB
Phencyclidine (PCP) Ur S: NOT DETECTED
Tricyclic, Ur Screen: NOT DETECTED

## 2021-10-01 LAB — CBC WITH DIFFERENTIAL/PLATELET
Abs Immature Granulocytes: 0.03 10*3/uL (ref 0.00–0.07)
Basophils Absolute: 0.1 10*3/uL (ref 0.0–0.1)
Basophils Relative: 1 %
Eosinophils Absolute: 0.1 10*3/uL (ref 0.0–0.5)
Eosinophils Relative: 1 %
HCT: 34.8 % — ABNORMAL LOW (ref 39.0–52.0)
Hemoglobin: 12 g/dL — ABNORMAL LOW (ref 13.0–17.0)
Immature Granulocytes: 0 %
Lymphocytes Relative: 15 %
Lymphs Abs: 1.4 10*3/uL (ref 0.7–4.0)
MCH: 28 pg (ref 26.0–34.0)
MCHC: 34.5 g/dL (ref 30.0–36.0)
MCV: 81.3 fL (ref 80.0–100.0)
Monocytes Absolute: 0.9 10*3/uL (ref 0.1–1.0)
Monocytes Relative: 10 %
Neutro Abs: 6.7 10*3/uL (ref 1.7–7.7)
Neutrophils Relative %: 73 %
Platelets: 322 10*3/uL (ref 150–400)
RBC: 4.28 MIL/uL (ref 4.22–5.81)
RDW: 13.9 % (ref 11.5–15.5)
WBC: 9.1 10*3/uL (ref 4.0–10.5)
nRBC: 0 % (ref 0.0–0.2)

## 2021-10-01 LAB — LIPASE, BLOOD: Lipase: 26 U/L (ref 11–51)

## 2021-10-01 LAB — ACETAMINOPHEN LEVEL: Acetaminophen (Tylenol), Serum: <10 ug/mL — ABNORMAL LOW (ref 10–30)

## 2021-10-01 LAB — ETHANOL: Alcohol, Ethyl (B): <10 mg/dL (ref <10)

## 2021-10-01 LAB — SALICYLATE LEVEL: Salicylate Lvl: <7 mg/dL — ABNORMAL LOW (ref 7.0–30.0)

## 2021-10-01 MED ORDER — MIDAZOLAM HCL 2 MG/2ML IJ SOLN
INTRAMUSCULAR | Status: AC
  Administered 2021-10-01: 2 mg via INTRAVENOUS
  Filled 2021-10-01: qty 2

## 2021-10-01 MED ORDER — DROPERIDOL 2.5 MG/ML IJ SOLN
2.5000 mg | Freq: Once | INTRAMUSCULAR | Status: AC
  Administered 2021-10-01: 2.5 mg via INTRAVENOUS

## 2021-10-01 MED ORDER — ACETAMINOPHEN 325 MG PO TABS: 650.0000 mg | ORAL_TABLET | Freq: Four times a day (QID) | ORAL | Status: DC | PRN

## 2021-10-01 MED ORDER — ACETAMINOPHEN 650 MG RE SUPP
650.0000 mg | Freq: Four times a day (QID) | RECTAL | Status: DC | PRN
Start: 2021-10-01 — End: 2021-10-02

## 2021-10-01 MED ORDER — LORAZEPAM 2 MG/ML IJ SOLN
2.0000 mg | Freq: Once | INTRAMUSCULAR | Status: AC
  Administered 2021-10-01: 2 mg via INTRAVENOUS
  Filled 2021-10-01: qty 1

## 2021-10-01 MED ORDER — SODIUM CHLORIDE 0.45 % IV SOLN: INTRAVENOUS | Status: DC

## 2021-10-01 MED ORDER — MIDAZOLAM HCL 2 MG/2ML IJ SOLN: 2.0000 mg | Freq: Once | INTRAMUSCULAR | Status: AC

## 2021-10-01 MED ORDER — MIDAZOLAM HCL 2 MG/2ML IJ SOLN: 2.0000 mg | Freq: Once | INTRAMUSCULAR | Status: DC

## 2021-10-01 MED ORDER — ENOXAPARIN SODIUM 60 MG/0.6ML IJ SOSY
50.0000 mg | PREFILLED_SYRINGE | INTRAMUSCULAR | Status: DC
  Administered 2021-10-01: 50 mg via SUBCUTANEOUS
  Filled 2021-10-01: qty 0.6

## 2021-10-01 MED ORDER — LACTATED RINGERS IV BOLUS
1000.0000 mL | Freq: Once | INTRAVENOUS | Status: AC
  Administered 2021-10-01: 1000 mL via INTRAVENOUS

## 2021-10-01 MED ORDER — ONDANSETRON HCL 4 MG/2ML IJ SOLN: 4.0000 mg | Freq: Four times a day (QID) | INTRAMUSCULAR | Status: DC | PRN

## 2021-10-01 MED ORDER — ONDANSETRON HCL 4 MG PO TABS: 4.0000 mg | ORAL_TABLET | Freq: Four times a day (QID) | ORAL | Status: DC | PRN

## 2021-10-01 NOTE — Progress Notes (Signed)
PHARMACIST - PHYSICIAN COMMUNICATION ? ?CONCERNING:  Enoxaparin (Lovenox) for DVT Prophylaxis  ? ? ?RECOMMENDATION: ?Patient was prescribed enoxaprin '40mg'$  q24 hours for VTE prophylaxis.  ? Danley Danker Weights  ? 10/01/21 0459  ?Weight: 103 kg (227 lb 1.2 oz)  ? ? ?Body mass index is 31.67 kg/m?. ? ?Estimated Creatinine Clearance: 112.7 mL/min (by C-G formula based on SCr of 1.15 mg/dL). ? ? ?Based on [place] patient is candidate for enoxaparin 0.'5mg'$ /kg TBW SQ every 24 hours based on BMI being >30. ? ? ?DESCRIPTION: ?Pharmacy has adjusted enoxaparin dose per [facility] policy. ? ?Patient is now receiving enoxaparin 50 mg every 24 hours  ? ? ?Wynelle Cleveland, PharmD ?Pharmacy Resident  ?10/01/2021 ?9:50 AM ? ? ?

## 2021-10-01 NOTE — Assessment & Plan Note (Addendum)
Patient noted to have significantly elevated total CK levels.   ?Continue on aggressive IV fluid resuscitation.   ?CK levels are trending down.  4501> 852 ?

## 2021-10-01 NOTE — Assessment & Plan Note (Addendum)
Likely related to rhabdomyolysis.   ?Unknown if patient has history of alcohol abuse  ?Trend liver enzymes. ?

## 2021-10-01 NOTE — Plan of Care (Addendum)
This RN - A little concerned for this guy - so young and difficult to wake up.  A/O x2 at best and sleeps REALLY Hard. Touch, name and persistence to get him to open eyes - then need to tell him to verabally respond.  Not safe to give anything PO at this point. - Dr. Informed ? ?Versed and ativan given last night for agitation.  Will continue to monitor ?Also took ABGs and urine sample w/ in/out to assess further. ? ? ?

## 2021-10-01 NOTE — ED Notes (Signed)
Pt taken to CT by this RN pt unable to complete the scan due to his erratic behavior. MD notified - see MAR for intervention.  ?

## 2021-10-01 NOTE — ED Notes (Signed)
Pt to CT with this RN - pt tolerated the scan. ?

## 2021-10-01 NOTE — Discharge Planning (Signed)
Mom came for visit and confirmed patient does not have anywhere to go once discharged and would really appreciate if we were able to find him some help for his narcotic abuse.  She did pick him up from jail  - but says "he's been pushing the limits for quite a while and it's time for him to figure it out on his own."  Dr. Informed of mom's thoughts/concerns. ?

## 2021-10-01 NOTE — H&P (Addendum)
?History and Physical  ? ? ?Patient: [name] VEL:[id number] DOB: [birth year] ?DOA: 10/01/2021 ?DOS: the patient was seen and examined on 10/01/2021 ?PCP: Default, Provider, MD  ?Patient coming from: Home ? ?Chief Complaint:  ?Chief Complaint  ?Patient presents with  ? Drug Overdose  ? ? ?Most of the history was obtained from ER notes as patient is lethargic and unable to provide any history ?HPI: [name] is a 31 y.o. male with medical history significant for evaluation of a possible drug overdose. Patient was said to be rambling and delusional upon arrival. ?EMS was called out to [facility] where the patient was found rambling and extremely agitated though not violent. Patient stated that he took Suboxone but may have taken something else. ?Patient is lethargic and unable to provide any history at this time. ?He received 1 dose of Lorazepam '2mg'$  and 1 dose of Midazolam '2mg'$  in the ER ?Noted to have elevated total CK levels ?Spoke to patient's mother who states that he was recently released from jail and has a history of substance abuse ? ? ? ?Review of Systems: unable to review all systems due to the inability of the patient to answer questions. ?Past Medical History:  ?Diagnosis Date  ? AKI (acute kidney injury) ([place])   ? 01/2012 resolved  ? ?Past Surgical History:  ?Procedure Laterality Date  ? APPENDECTOMY  2007  ? ruptured   ? HIP SURGERY    ? ?Social History:  reports that he has been smoking cigarettes. He has a 4.00 pack-year smoking history. His smokeless tobacco use includes chew. He reports current alcohol use of about 1.0 - 2.0 standard drink per week. He reports current drug use. Frequency: 7.00 times per week. Drug: Marijuana. ? ?No Known Allergies ? ?Family History  ?Problem Relation Age of Onset  ? Hypertension Father   ? ? ?Prior to Admission medications   ?Medication Sig Start Date End Date Taking? Authorizing Provider  ?clindamycin (CLEOCIN) 300 MG capsule Take 1 capsule (300 mg total) by mouth 3  (three) times daily. ?Patient not taking: Reported on 10/01/2021 02/21/16   Demetrios Loll, MD  ?cyclobenzaprine (FLEXERIL) 10 MG tablet Take 1 tablet (10 mg total) by mouth 2 (two) times daily as needed for muscle spasms. ?Patient not taking: Reported on 10/01/2021 06/08/19   Day, Lovena Le, MD  ?famotidine (PEPCID) 20 MG tablet Take 1 tablet (20 mg total) by mouth 2 (two) times daily. ?Patient not taking: Reported on 10/01/2021 05/23/12   Domenic Moras, PA-C  ?traMADol-acetaminophen (ULTRACET) 37.5-325 MG tablet Take 1 tablet by mouth every 6 (six) hours as needed for moderate pain or severe pain. ?Patient not taking: Reported on 10/01/2021 02/21/16   Demetrios Loll, MD  ? ? ?Physical Exam: ?Vitals:  ? 10/01/21 0645 10/01/21 0700 10/01/21 0715 10/01/21 0900  ?BP: (!) 135/92 (!) 163/103  140/88  ?Pulse: 92 97 83 82  ?Resp: '16 19 17 14  '$ ?Temp:    98.4 ?F (36.9 ?C)  ?TempSrc:    Oral  ?SpO2: 100% 100% 100% 100%  ?Weight:      ?Height:      ? ?Physical Exam ?Vitals and nursing note reviewed.  ?Constitutional:   ?   Comments: Lethargic.  Opens eyes to deep sternal rub  ?HENT:  ?   Head: Normocephalic and atraumatic.  ?   Nose: Nose normal.  ?   Mouth/Throat:  ?   Mouth: Mucous membranes are dry.  ?Cardiovascular:  ?   Rate and Rhythm: Normal  rate and regular rhythm.  ?Pulmonary:  ?   Effort: Pulmonary effort is normal.  ?   Breath sounds: Normal breath sounds.  ?Abdominal:  ?   General: Abdomen is flat. Bowel sounds are normal.  ?   Palpations: Abdomen is soft.  ?Musculoskeletal:     ?   General: Normal range of motion.  ?   Cervical back: Normal range of motion and neck supple.  ?Skin: ?   General: Skin is warm and dry.  ?   Comments: Scratch marks on both lower extremities  ?Neurological:  ?   Comments: Unable to assess  ?Psychiatric:  ?   Comments: Unable to assess  ? ? ?Data Reviewed: ?Relevant notes from primary care and specialist visits, past discharge summaries as available in EHR, including Care Everywhere. ?Prior diagnostic  testing as pertinent to current admission diagnoses ?Updated medications and problem lists for reconciliation ?ED course, including vitals, labs, imaging, treatment and response to treatment ?Triage notes, nursing and pharmacy notes and ED provider's notes ?Notable results as noted in HPI ?Labs reviewed.  Total CK4 501, BUN 28, creatinine 1.15, calcium 8.8, AST 98, ALT 47, Tylenol level less than 10, salicylate level less than 7, hemoglobin 12.0, hematocrit 34.8, MCV 81.3, platelet count 322 ?CT scan of the head and cervical spine without contrast showed no acute intracranial abnormalities. The appearance of the brain is normal. No acute abnormality of the cervical spine. ?Twelve-lead EKG reviewed by me shows sinus tachycardia with right axis deviation ?There are no new results to review at this time. ? ?Assessment and Plan: ?* AMS (altered mental status) ?Most likely related to substance use/abuse ?Patient was brought into the ER for evaluation of mental status changes and was said to be rambling and delusional upon arrival. ?He received 1 dose of lorazepam and another dose of Versed in the ER and is currently lethargic but protecting his airway ?Urine drug screen still pending ?Keep n.p.o. for now ?Place on cardiac monitor  ?Monitor closely during this hospitalization ? ?Rhabdomyolysis ?Patient noted to have significantly elevated total CK levels ?Continue aggressive IV fluid resuscitation ?Repeat CK levels in a.m. ? ?Transaminitis ?Most likely related to rhabdomyolysis ?Unknown if patient has a history of alcohol use or abuse ?Monitor liver enzymes ? ? ? ? ? Advance Care Planning:   Code Status: Full Code  ? ?Consults:  None ? ?Family Communication: Called and discussed patient's condition with his mother, Ms [name] over the phone.  All questions and concerns have been addressed.  She verbalizes understanding and agrees with the plan.  She requests possible discharge to a drug rehab once patient is  stable ? ?Severity of Illness: ?The appropriate patient status for this patient is INPATIENT. Inpatient status is judged to be reasonable and necessary in order to provide the required intensity of service to ensure the patient's safety. The patient's presenting symptoms, physical exam findings, and initial radiographic and laboratory data in the context of their chronic comorbidities is felt to place them at high risk for further clinical deterioration. Furthermore, it is not anticipated that the patient will be medically stable for discharge from the hospital within 2 midnights of admission.  ? ?* I certify that at the point of admission it is my clinical judgment that the patient will require inpatient hospital care spanning beyond 2 midnights from the point of admission due to high intensity of service, high risk for further deterioration and high frequency of surveillance required.* ? ?Author: ?Collier Bullock, MD ?10/01/2021  10:09 AM ? ?For on call review [email or web address].  ?

## 2021-10-01 NOTE — ED Notes (Addendum)
In&Out cath attempt unsuccessful due to pts behavior. Urinal @ the bedside.  ?

## 2021-10-01 NOTE — ED Triage Notes (Signed)
[facility]. Pt rambling and delusional upon arrival. Pt cooperative with staff at this time. Pt states he wasn't what he took tonight.  ?

## 2021-10-01 NOTE — Assessment & Plan Note (Addendum)
Most likely related to substance abuse/use. ?Patient was brought in the ED for the evaluation of mental status changes and was said to be delusional upon arrival. He was given a dose of lorazepam and Versed in the ED and he is was lethargic but protecting his airway. ?UDS + for amphetamines, benzos, opiates. ?Continue telemetry.  Patient was kept NPO. ?Monitor closely during this hospitalization ?Patient seems to have improved , alert, oriented x3. ?Will resume diet. ?

## 2021-10-01 NOTE — ED Provider Notes (Signed)
? ?[facility] ?Provider Note ? ? ? Event Date/Time  ? First MD Initiated Contact with Patient 10/01/21 [phone]   ?  (approximate) ? ? ?History  ? ?Drug Overdose ? ?Level 5 caveat:  history/ROS limited by acute/critical illness ? ?HPI ? ?[name] is a 31 y.o. male whose medical history includes prior orthopedic injuries to his left hip (according to the patient) due to a trauma.  He presents by EMS for evaluation of altered mental status and probable drug overdose.  They were called out to the [facility] where the patient was rambling and extremely agitated though not violent.  He states that he took some Suboxone but may have taken something else.  He is not able to provide a significant history.  He reports that his left knee hurts and that his left hip hurts, but the left hip is the one that is hurt him for years.  He does not think he had any trauma.  He is somewhat evasive when asked if he took any other drugs but he states he thinks he only took Suboxone.  Very difficult to get a comprehensive history or exam. ?  ? ? ?Physical Exam  ? ?Triage Vital Signs: ?ED Triage Vitals  ?Enc Vitals Group  ?   BP 10/01/21 0500 (!) 156/89  ?   Pulse Rate 10/01/21 0500 (!) 135  ?   Resp 10/01/21 0500 20  ?   Temp 10/01/21 0500 98.8 ?F (37.1 ?C)  ?   Temp Source 10/01/21 0500 Oral  ?   SpO2 10/01/21 0500 96 %  ?   Weight 10/01/21 0459 103 kg (227 lb 1.2 oz)  ?   Height 10/01/21 0459 1.803 m ('5\' 11"'$ )  ?   Head Circumference --   ?   Peak Flow --   ?   Pain Score 10/01/21 0459 10  ?   Pain Loc --   ?   Pain Edu? --   ?   Excl. in [place]? --   ? ? ?Most recent vital signs: ?Vitals:  ? 10/01/21 0700 10/01/21 0715  ?BP: (!) 163/103   ?Pulse: 97 83  ?Resp: 19 17  ?Temp:    ?SpO2: 100% 100%  ? ? ? ?General: Awake, extremely agitated, consistent with stimulant abuse. ?HEENT: Very poor dentition consistent with substance abuse.  No obvious dental injury. ?CV:  Good peripheral perfusion.  Tachycardia in the 130s.   Regular rhythm. ?Resp:  Normal effort.  Increased rate.  Lungs are clear to auscultation. ?Abd:  No distention.  No tenderness to palpation. ?Other:  Patient has excoriations or scratches over much of his skin surface including his extremities and torso.  He has a small bruise to his left knee but no gross deformity, no effusion, and the patient is flexing and extending his knee with no apparent difficulty or pain.  Compartments are soft and easily compressible throughout the left lower extremity with no sign of compartment syndrome and with good distal perfusion.  He has no gross deformity or obvious abnormality of his left hip and he indicates that this pain has been present for a long time. ? ? ?ED Results / Procedures / Treatments  ? ?Labs ?(all labs ordered are listed, but only abnormal results are displayed) ?Labs Reviewed  ?CK - Abnormal; Notable for the following components:  ?    Result Value  ? Total CK 4,501 (*)   ? All other components within normal limits  ?CBC WITH  DIFFERENTIAL/PLATELET - Abnormal; Notable for the following components:  ? Hemoglobin 12.0 (*)   ? HCT 34.8 (*)   ? All other components within normal limits  ?COMPREHENSIVE METABOLIC PANEL - Abnormal; Notable for the following components:  ? BUN 28 (*)   ? Calcium 8.8 (*)   ? AST 98 (*)   ? ALT 47 (*)   ? All other components within normal limits  ?ACETAMINOPHEN LEVEL - Abnormal; Notable for the following components:  ? Acetaminophen (Tylenol), Serum <10 (*)   ? All other components within normal limits  ?SALICYLATE LEVEL - Abnormal; Notable for the following components:  ? Salicylate Lvl <9.0 (*)   ? All other components within normal limits  ?LIPASE, BLOOD  ?ETHANOL  ?URINALYSIS, COMPLETE (UACMP) WITH MICROSCOPIC  ?URINE DRUG SCREEN, QUALITATIVE ([facility] ONLY)  ? ? ? ?EKG ? ?ED ECG REPORT ?IHinda Kehr, the attending physician, personally viewed and interpreted this ECG. ? ?Date: 10/01/2021 ?EKG Time: 5:02 AM ?Rate: 134 ?Rhythm: Sinus  tachycardia ?QRS Axis: Borderline right axis deviation ?Intervals: normal ?ST/T Wave abnormalities: Non-specific ST segment / T-wave changes, but no clear evidence of acute ischemia. ?Narrative Interpretation: no definitive evidence of acute ischemia; does not meet STEMI criteria. ? ? ? ?RADIOLOGY ?I personally reviewed the patient's head CT and CT cervical spine and I see no evidence of acute abnormality. ? ? ? ?PROCEDURES: ? ?Critical Care performed: Yes, see critical care procedure note(s) ? ?.1-3 Lead EKG Interpretation ?Performed by: Hinda Kehr, MD ?Authorized by: Hinda Kehr, MD  ? ?  Interpretation: abnormal   ?  ECG rate:  130 ?  ECG rate assessment: tachycardic   ?  Rhythm: sinus tachycardia   ?  Ectopy: none   ?  Conduction: normal   ?.Critical Care ?Performed by: Hinda Kehr, MD ?Authorized by: Hinda Kehr, MD  ? ?Critical care provider statement:  ?  Critical care time (minutes):  45 ?  Critical care time was exclusive of:  Separately billable procedures and treating other patients ?  Critical care was necessary to treat or prevent imminent or life-threatening deterioration of the following conditions:  Toxidrome and CNS failure or compromise ?  Critical care was time spent personally by me on the following activities:  Development of treatment plan with patient or surrogate, evaluation of patient's response to treatment, examination of patient, obtaining history from patient or surrogate, ordering and performing treatments and interventions, ordering and review of laboratory studies, ordering and review of radiographic studies, pulse oximetry, re-evaluation of patient's condition and review of old charts ? ? ?MEDICATIONS ORDERED IN ED: ?Medications  ?lactated ringers bolus 1,000 mL (0 mLs Intravenous Stopped 10/01/21 0612)  ?midazolam (VERSED) injection 2 mg (2 mg Intravenous Not Given 10/01/21 0525)  ?droperidol (INAPSINE) 2.5 MG/ML injection 2.5 mg (2.5 mg Intravenous Given 10/01/21 0611)   ?LORazepam (ATIVAN) injection 2 mg (2 mg Intravenous Given 10/01/21 0616)  ?lactated ringers bolus 1,000 mL (0 mLs Intravenous Stopped 10/01/21 0719)  ? ? ? ?IMPRESSION / MDM / ASSESSMENT AND PLAN / ED COURSE  ?I reviewed the triage vital signs and the nursing notes. ?             ?               ? ?Differential diagnosis includes, but is not limited to, stimulant abuse/overdose, other nonspecific drug interaction or side effect, acute encephalopathy from infectious source, acute intracranial hemorrhage, acute psychiatric illness. ? ?The patient is on the cardiac monitor  to evaluate for evidence of arrhythmia and/or significant heart rate changes. ? ?Patient arrives with behavior most consistent with stimulant overdose.  He was not being combative but seem to be unable to calm himself down or remain still.  He was even apologizing to staff as he was thrashing around.  IV was placed and I ordered Versed 2 mg IV which resulted in the patient calming down immediately and falling asleep.  I put on 2 L of oxygen by nasal cannula just to be safe with his oxygen saturation having come down to around 91%.  He was still protecting his airway and in no distress. ? ?Given his presentation and my concern for stimulant overdose of unknown duration, labs ordered include CMP, CBC, acetaminophen level, salicylate level, CK, ethanol level, lipase.  Urinalysis also ordered.  Rhabdomyolysis is very possible and I ordered LR 1 L IV bolus.  Patient may require additional benzodiazepines and/or antipsychotics.  Once we are certain he has calmed down appropriately, I will obtain CT scan of the head and cervical spine given that I cannot verify trauma history or lack thereof and we need to rule out intracranial bleed. ? ?Based on physical exam I have no concerns for crush injury or orthopedic trauma, compartment syndrome, etc., that could lead to rhabdomyolysis.  This seems to be more of a metabolic/toxidrome issue ? ?Clinical Course as of  10/01/21 0753  ?Sat Oct 01, 2021  ?0609 I reviewed the patient's labs.  Comprehensive metabolic panel shows a slight elevation of LFTs which could be due to alcohol use but could also be the result of rhabdomyolysi

## 2021-10-02 LAB — BASIC METABOLIC PANEL
Anion gap: 5 (ref 5–15)
BUN: 16 mg/dL (ref 6–20)
CO2: 25 mmol/L (ref 22–32)
Calcium: 8 mg/dL — ABNORMAL LOW (ref 8.9–10.3)
Chloride: 104 mmol/L (ref 98–111)
Creatinine, Ser: 0.79 mg/dL (ref 0.61–1.24)
GFR, Estimated: >60 mL/min (ref >60)
Glucose, Bld: 133 mg/dL — ABNORMAL HIGH (ref 70–99)
Potassium: 3.1 mmol/L — ABNORMAL LOW (ref 3.5–5.1)
Sodium: 134 mmol/L — ABNORMAL LOW (ref 135–145)

## 2021-10-02 LAB — CBC
HCT: 32.9 % — ABNORMAL LOW (ref 39.0–52.0)
Hemoglobin: 11.3 g/dL — ABNORMAL LOW (ref 13.0–17.0)
MCH: 28 pg (ref 26.0–34.0)
MCHC: 34.3 g/dL (ref 30.0–36.0)
MCV: 81.6 fL (ref 80.0–100.0)
Platelets: 266 10*3/uL (ref 150–400)
RBC: 4.03 MIL/uL — ABNORMAL LOW (ref 4.22–5.81)
RDW: 14.2 % (ref 11.5–15.5)
WBC: 7.4 10*3/uL (ref 4.0–10.5)
nRBC: 0 % (ref 0.0–0.2)

## 2021-10-02 LAB — HIV ANTIBODY (ROUTINE TESTING W REFLEX): HIV Screen 4th Generation wRfx: NONREACTIVE

## 2021-10-02 LAB — CK: Total CK: 852 U/L — ABNORMAL HIGH (ref 49–397)

## 2021-10-02 MED ORDER — POTASSIUM CHLORIDE 20 MEQ PO PACK
40.0000 meq | PACK | Freq: Once | ORAL | Status: AC
  Administered 2021-10-02: 40 meq via ORAL
  Filled 2021-10-02: qty 2

## 2021-10-02 NOTE — Progress Notes (Signed)
Pt called out to nurses station stated he wanted to leave now. D/C pt ive and tele box ama paper signed md informed  ?

## 2021-10-02 NOTE — Hospital Course (Addendum)
This 31 years old male with PMH significant of substance abuse presented to the ED for the evaluation of possible drug overdose. Patient was rambling and delusional at [place].  EMS was called out at [facility] where patient was found extremely agitated though not violent.  Patient states he took Suboxone but he may have taken something else.  Patient was lethargic and unable to provide history at the time of admission.  He has received a dose of lorazepam,  midazolam.  He is noted to have very high CK levels.  Patient's mother reports that he was recently released from jail and has a history of substance abuse. ?Patient was admitted for rhabdomyolysis and altered mental status secondary to substance abuse.  Patient was continued on IV hydration.  CK level has significantly improved.  Patient back to baseline mental status alert fully awake, later in the evening patient decided to leave [place].  He signed AMA form and left.  He was explained the risks and benefits of leaving [place].  He understood. ?

## 2021-10-02 NOTE — Progress Notes (Signed)
?Progress Note ? ? ?Patient: [name] MGQ:[id number] DOB: 1990/10/21 DOA: 10/01/2021     1 ? ?DOS: the patient was seen and examined on 10/02/2021 ?  ?Brief hospital course: ?This 31 years old male with PMH significant of substance abuse presented to the ED for the evaluation of possible drug overdose.Patient was rambling and delusional at [place].  EMS was called out at [facility] where patient was found extremely agitated though not violent.  Patient states he took Suboxone but he may have taken something else.  Patient was lethargic and unable to provide history at the time of admission.  He has received a dose of lorazepam,  midazolam.  He is noted to have very high CK levels.  Patient's mother reports that he was recently released from jail and has a history of substance abuse. ?Patient is admitted for rhabdomyolysis and altered mental status secondary to substance abuse. ? ?Assessment and Plan: ?* AMS (altered mental status) ?Most likely related to substance abuse/use. ?Patient was brought in the ED for the evaluation of mental status changes and was said to be delusional upon arrival. He was given a dose of lorazepam and Versed in the ED and he is was lethargic but protecting his airway. ?UDS + for amphetamines, benzos, opiates. ?Continue telemetry.  Patient was kept NPO. ?Monitor closely during this hospitalization ?Patient seems to have improved , alert, oriented x3. ?Will resume diet. ? ?Rhabdomyolysis ?Patient noted to have significantly elevated total CK levels.   ?Continue on aggressive IV fluid resuscitation.   ?CK levels are trending down.  4501> 852 ? ?Transaminitis ?Likely related to rhabdomyolysis.   ?Unknown if patient has history of alcohol abuse  ?Trend liver enzymes. ? ? ?Hypokalemia: ?Replaced.  Continue to monitor ? ? ?Subjective: Patient was seen and examined at bedside.  Overnight events noted. ?Patient reports feeling improved.  He is alert and oriented x 3, following  commands. ? ? ?Physical Exam: ?Vitals:  ? 10/01/21 2326 10/01/21 2347 10/02/21 0416 10/02/21 0827  ?BP: 137/84 129/83 122/70 124/83  ?Pulse: (!) 104 88 (!) 104 98  ?Resp: '18 16 18 14  '$ ?Temp: 98.9 ?F (37.2 ?C) 98.7 ?F (37.1 ?C) 98.5 ?F (36.9 ?C) 98.4 ?F (36.9 ?C)  ?TempSrc: Oral Oral    ?SpO2: 96% 95% 96% 97%  ?Weight:      ?Height:      ? ?General exam: Appears comfortable, not in any acute distress.  Deconditioned ?Respiratory system: CTA bilaterally, no wheezing, no crackles, normal respiratory effort. ?Cardiovascular system: S1-S2 heard, regular rate and rhythm, no murmur. ?Gastrointestinal system: Abdomen is soft, nontender, nondistended, BS+. ?Central nervous system: Alert, oriented x3, no focal neurological deficits. ?Extremities: No edema, no cyanosis, no clubbing. ?Psychiatry: Mood, insight, judgment normal. ? ? ?Data Reviewed: ?I have Reviewed nursing notes, Vitals, and Lab results since pt's last encounter. Pertinent lab results CBC, BMP, urine drug screen ?I have ordered test including CBC, BMP ?I have independently visualized and interpreted imaging CT head and CT C-spine which showed no acute abnormality. ?I have reviewed the last note from hospitalist,  ?I have discussed pt's care plan and test results with patient.  ? ?Family Communication: No family at bedside ? ?Disposition: ?Status is: Inpatient ?Remains inpatient appropriate because:  ? ?Patient admitted for altered mental status possible substance abuse/drug overdose. ?Urine drug screen positive for amphetamines, opioids, benzos. ?Patient is alert and oriented x3 now.  Following commands. ?TOC consulted for evaluation for safe disposition. ? ? ? Planned Discharge Destination: Home ? ? ? ? ?  Time spent: 50 minutes ? ?Author: ?Shawna Clamp, MD ?10/02/2021 11:36 AM ? ?For on call review [email or web address].  ?

## 2021-10-03 NOTE — Discharge Summary (Signed)
?Physician Discharge Summary ?  ?Patient: [name] MRN: [id number] DOB: 1991-04-10  ?Admit date:     10/01/2021  ?Discharge date: 10/02/21  ?Discharge Physician: Shawna Clamp  ? ?PCP: Default, Provider, MD  ? ?Recommendations at discharge:  ?Patient left AGAINST MEDICAL ADVICE. ? ?Discharge Diagnoses: ?Principal Problem: ?  AMS (altered mental status) ?Active Problems: ?  Rhabdomyolysis ?  Transaminitis ? ?Resolved Problems: ?  * No resolved hospital problems. * ? ?Hospital Course: ?This 31 years old male with PMH significant of substance abuse presented to the ED for the evaluation of possible drug overdose. Patient was rambling and delusional at [place].  EMS was called out at [facility] where patient was found extremely agitated though not violent.  Patient states he took Suboxone but he may have taken something else.  Patient was lethargic and unable to provide history at the time of admission.  He has received a dose of lorazepam,  midazolam.  He is noted to have very high CK levels.  Patient's mother reports that he was recently released from jail and has a history of substance abuse. ?Patient was admitted for rhabdomyolysis and altered mental status secondary to substance abuse.  Patient was continued on IV hydration.  CK level has significantly improved.  Patient back to baseline mental status alert fully awake, later in the evening patient decided to leave [place].  He signed AMA form and left.  He was explained the risks and benefits of leaving [place].  He understood. ? ?Assessment and Plan: ?* AMS (altered mental status) ?Most likely related to substance abuse/use. ?Patient was brought in the ED for the evaluation of mental status changes and was said to be delusional upon arrival. He was given a dose of lorazepam and Versed in the ED and he is was lethargic but protecting his airway. ?UDS + for amphetamines, benzos, opiates. ?Continue telemetry.  Patient was kept  NPO. ?Monitor closely during this hospitalization ?Patient seems to have improved , alert, oriented x3. ?Will resume diet. ? ?Rhabdomyolysis ?Patient noted to have significantly elevated total CK levels.   ?Continue on aggressive IV fluid resuscitation.   ?CK levels are trending down.  4501> 852 ? ?Transaminitis ?Likely related to rhabdomyolysis.   ?Unknown if patient has history of alcohol abuse  ?Trend liver enzymes. ? ? ? ?Consultants: None ?Procedures performed: None ?Disposition:  Left AGAINST MEDICAL ADVICE ?Diet recommendation:  ?Carb modified diet ?DISCHARGE MEDICATION: ?Allergies as of 10/02/2021   ?No Known Allergies ?  ? ?  ?Medication List  ?  ? ?ASK your doctor about these medications   ? ?clindamycin 300 MG capsule ?Commonly known as: Cleocin ?Take 1 capsule (300 mg total) by mouth 3 (three) times daily. ?  ?cyclobenzaprine 10 MG tablet ?Commonly known as: FLEXERIL ?Take 1 tablet (10 mg total) by mouth 2 (two) times daily as needed for muscle spasms. ?  ?famotidine 20 MG tablet ?Commonly known as: PEPCID ?Take 1 tablet (20 mg total) by mouth 2 (two) times daily. ?  ?traMADol-acetaminophen 37.5-325 MG tablet ?Commonly known as: Ultracet ?Take 1 tablet by mouth every 6 (six) hours as needed for moderate pain or severe pain. ?  ? ?  ? ? ?Discharge Exam: ?Filed Weights  ? 10/01/21 0459 10/01/21 1525  ?Weight: 103 kg 93.3 kg  ? ?Left AGAINST MEDICAL ADVICE ? ?Condition at discharge: good ? ?The results of significant diagnostics from this hospitalization (including imaging, microbiology, ancillary and laboratory) are listed below for reference.  ? ?  Imaging Studies: ?CT Head Wo Contrast ? ?Result Date: 10/01/2021 ?CLINICAL DATA:  31 year old male with history of drug overdose. Altered mental status. EXAM: CT HEAD WITHOUT CONTRAST CT CERVICAL SPINE WITHOUT CONTRAST TECHNIQUE: Multidetector CT imaging of the head and cervical spine was performed following the standard protocol without intravenous contrast.  Multiplanar CT image reconstructions of the cervical spine were also generated. RADIATION DOSE REDUCTION: This exam was performed according to the departmental dose-optimization program which includes automated exposure control, adjustment of the mA and/or kV according to patient size and/or use of iterative reconstruction technique. COMPARISON:  No priors. FINDINGS: CT HEAD FINDINGS Brain: No evidence of acute infarction, hemorrhage, hydrocephalus, extra-axial collection or mass lesion/mass effect. Vascular: No hyperdense vessel or unexpected calcification. Skull: Normal. Negative for fracture or focal lesion. Sinuses/Orbits: No acute finding. Mild multifocal mucosal thickening in the maxillary sinuses bilaterally. Other: None. CT CERVICAL SPINE FINDINGS Alignment: Normal. Skull base and vertebrae: No acute fracture. No primary bone lesion or focal pathologic process. Soft tissues and spinal canal: No prevertebral fluid or swelling. No visible canal hematoma. Disc levels: No significant degenerative disc disease or facet arthropathy. Upper chest: Negative. Other: None. IMPRESSION: 1. No acute intracranial abnormalities. The appearance of the brain is normal. 2. No acute abnormality of the cervical spine. Electronically Signed   By: Vinnie Langton M.D.   On: 10/01/2021 07:07  ? ?CT Cervical Spine Wo Contrast ? ?Result Date: 10/01/2021 ?CLINICAL DATA:  31 year old male with history of drug overdose. Altered mental status. EXAM: CT HEAD WITHOUT CONTRAST CT CERVICAL SPINE WITHOUT CONTRAST TECHNIQUE: Multidetector CT imaging of the head and cervical spine was performed following the standard protocol without intravenous contrast. Multiplanar CT image reconstructions of the cervical spine were also generated. RADIATION DOSE REDUCTION: This exam was performed according to the departmental dose-optimization program which includes automated exposure control, adjustment of the mA and/or kV according to patient size and/or  use of iterative reconstruction technique. COMPARISON:  No priors. FINDINGS: CT HEAD FINDINGS Brain: No evidence of acute infarction, hemorrhage, hydrocephalus, extra-axial collection or mass lesion/mass effect. Vascular: No hyperdense vessel or unexpected calcification. Skull: Normal. Negative for fracture or focal lesion. Sinuses/Orbits: No acute finding. Mild multifocal mucosal thickening in the maxillary sinuses bilaterally. Other: None. CT CERVICAL SPINE FINDINGS Alignment: Normal. Skull base and vertebrae: No acute fracture. No primary bone lesion or focal pathologic process. Soft tissues and spinal canal: No prevertebral fluid or swelling. No visible canal hematoma. Disc levels: No significant degenerative disc disease or facet arthropathy. Upper chest: Negative. Other: None. IMPRESSION: 1. No acute intracranial abnormalities. The appearance of the brain is normal. 2. No acute abnormality of the cervical spine. Electronically Signed   By: Vinnie Langton M.D.   On: 10/01/2021 07:07   ? ?Microbiology: ?Results for orders placed or performed during the hospital encounter of 02/20/16  ?Culture, blood (Routine x 2)     Status: None  ? Collection Time: 02/20/16  5:33 PM  ? Specimen: BLOOD  ?Result Value Ref Range Status  ? Specimen Description BLOOD RIGHT ANTECUBITAL  Final  ? Special Requests   Final  ?  BOTTLES DRAWN AEROBIC AND ANAEROBIC  AER [place] ANA 10CC  ? Culture NO GROWTH 5 DAYS  Final  ? Report Status 02/25/2016 FINAL  Final  ?Aerobic/Anaerobic Culture (surgical/deep wound)     Status: None  ? Collection Time: 02/20/16  8:13 PM  ? Specimen: Foot; Wound  ?Result Value Ref Range Status  ? Specimen Description FOOT RIGHT  Final  ? Special Requests NONE  Final  ? Gram Stain   Final  ?  RARE WBC PRESENT, PREDOMINANTLY MONONUCLEAR ?MODERATE GRAM POSITIVE COCCI IN CLUSTERS IN PAIRS ?  ? Culture   Final  ?  ABUNDANT STAPHYLOCOCCUS AUREUS ?MODERATE GROUP B STREP(S.AGALACTIAE)ISOLATED ?TESTING AGAINST S.  AGALACTIAE NOT ROUTINELY PERFORMED DUE TO PREDICTABILITY OF AMP/PEN/VAN SUSCEPTIBILITY. ?Performed at [facility] ?  ? Report Status 02/26/2016 FINAL  Final  ? Organism ID, Bacteria STAPHYLOCOCCUS AUREUS  Final  ?

## 2022-12-17 ENCOUNTER — Emergency Department: Payer: MEDICAID

## 2022-12-17 ENCOUNTER — Emergency Department: Payer: Medicaid Other

## 2022-12-17 ENCOUNTER — Inpatient Hospital Stay
Admission: EM | Admit: 2022-12-17 | Discharge: 2022-12-19 | DRG: 199 | Disposition: A | Discharge status: Home / Self Care | Payer: Medicaid Other | Attending: Internal Medicine | Admitting: Internal Medicine

## 2022-12-17 ENCOUNTER — Other Ambulatory Visit: Payer: Self-pay

## 2022-12-17 ENCOUNTER — Encounter: Payer: Self-pay | Admitting: Emergency Medicine

## 2022-12-17 ENCOUNTER — Encounter (HOSPITAL_COMMUNITY): Payer: Self-pay

## 2022-12-17 DIAGNOSIS — F1010 Alcohol abuse, uncomplicated: Secondary | ICD-10-CM | POA: Diagnosis present

## 2022-12-17 DIAGNOSIS — E872 Acidosis, unspecified: Secondary | ICD-10-CM | POA: Diagnosis not present

## 2022-12-17 DIAGNOSIS — F17210 Nicotine dependence, cigarettes, uncomplicated: Secondary | ICD-10-CM | POA: Diagnosis present

## 2022-12-17 DIAGNOSIS — T43655A Adverse effect of methamphetamines, initial encounter: Secondary | ICD-10-CM | POA: Diagnosis present

## 2022-12-17 DIAGNOSIS — Y929 Unspecified place or not applicable: Secondary | ICD-10-CM

## 2022-12-17 DIAGNOSIS — D75839 Thrombocytosis, unspecified: Secondary | ICD-10-CM | POA: Diagnosis present

## 2022-12-17 DIAGNOSIS — F1510 Other stimulant abuse, uncomplicated: Secondary | ICD-10-CM

## 2022-12-17 DIAGNOSIS — M79605 Pain in left leg: Secondary | ICD-10-CM | POA: Diagnosis not present

## 2022-12-17 DIAGNOSIS — G928 Other toxic encephalopathy: Secondary | ICD-10-CM | POA: Diagnosis present

## 2022-12-17 DIAGNOSIS — N179 Acute kidney failure, unspecified: Secondary | ICD-10-CM

## 2022-12-17 DIAGNOSIS — Y900 Blood alcohol level of less than 20 mg/100 ml: Secondary | ICD-10-CM | POA: Diagnosis present

## 2022-12-17 DIAGNOSIS — J982 Interstitial emphysema: Secondary | ICD-10-CM | POA: Diagnosis present

## 2022-12-17 DIAGNOSIS — Z1152 Encounter for screening for COVID-19: Secondary | ICD-10-CM

## 2022-12-17 DIAGNOSIS — Z8249 Family history of ischemic heart disease and other diseases of the circulatory system: Secondary | ICD-10-CM | POA: Diagnosis not present

## 2022-12-17 DIAGNOSIS — R0600 Dyspnea, unspecified: Secondary | ICD-10-CM

## 2022-12-17 LAB — COMPREHENSIVE METABOLIC PANEL
ALT: 25 U/L (ref 0–44)
AST: 46 U/L — ABNORMAL HIGH (ref 15–41)
Albumin: 5.4 g/dL — ABNORMAL HIGH (ref 3.5–5.0)
Alkaline Phosphatase: 92 U/L (ref 38–126)
Anion gap: 28 — ABNORMAL HIGH (ref 5–15)
BUN: 41 mg/dL — ABNORMAL HIGH (ref 6–20)
CO2: 16 mmol/L — ABNORMAL LOW (ref 22–32)
Calcium: 10.3 mg/dL (ref 8.9–10.3)
Chloride: 95 mmol/L — ABNORMAL LOW (ref 98–111)
Creatinine, Ser: 4.35 mg/dL — ABNORMAL HIGH (ref 0.61–1.24)
GFR, Estimated: 17 mL/min — ABNORMAL LOW (ref >60)
Glucose, Bld: 157 mg/dL — ABNORMAL HIGH (ref 70–99)
Potassium: 3.8 mmol/L (ref 3.5–5.1)
Sodium: 139 mmol/L (ref 135–145)
Total Bilirubin: 0.7 mg/dL (ref 0.3–1.2)
Total Protein: 10.4 g/dL — ABNORMAL HIGH (ref 6.5–8.1)

## 2022-12-17 LAB — CBC WITH DIFFERENTIAL/PLATELET
Abs Immature Granulocytes: 0.48 10*3/uL — ABNORMAL HIGH (ref 0.00–0.07)
Basophils Absolute: 0.1 10*3/uL (ref 0.0–0.1)
Basophils Relative: 0 %
Eosinophils Absolute: 0 10*3/uL (ref 0.0–0.5)
Eosinophils Relative: 0 %
HCT: 53.2 % — ABNORMAL HIGH (ref 39.0–52.0)
Hemoglobin: 17.2 g/dL — ABNORMAL HIGH (ref 13.0–17.0)
Immature Granulocytes: 1 %
Lymphocytes Relative: 3 %
Lymphs Abs: 1.1 10*3/uL (ref 0.7–4.0)
MCH: 27.9 pg (ref 26.0–34.0)
MCHC: 32.3 g/dL (ref 30.0–36.0)
MCV: 86.4 fL (ref 80.0–100.0)
Monocytes Absolute: 1.7 10*3/uL — ABNORMAL HIGH (ref 0.1–1.0)
Monocytes Relative: 5 %
Neutro Abs: 32.6 10*3/uL — ABNORMAL HIGH (ref 1.7–7.7)
Neutrophils Relative %: 91 %
Platelets: 860 10*3/uL — ABNORMAL HIGH (ref 150–400)
RBC: 6.16 MIL/uL — ABNORMAL HIGH (ref 4.22–5.81)
RDW: 13.6 % (ref 11.5–15.5)
Smear Review: INCREASED
WBC: 36 10*3/uL — ABNORMAL HIGH (ref 4.0–10.5)
nRBC: 0 % (ref 0.0–0.2)

## 2022-12-17 LAB — LACTIC ACID, PLASMA
Lactic Acid, Venous: 4.5 mmol/L (ref 0.5–1.9)
Lactic Acid, Venous: 4.5 mmol/L (ref 0.5–1.9)

## 2022-12-17 LAB — URINALYSIS, COMPLETE (UACMP) WITH MICROSCOPIC
Bacteria, UA: NONE SEEN
Bilirubin Urine: NEGATIVE
Glucose, UA: NEGATIVE mg/dL
Ketones, ur: NEGATIVE mg/dL
Leukocytes,Ua: NEGATIVE
Nitrite: NEGATIVE
Protein, ur: NEGATIVE mg/dL
Specific Gravity, Urine: 1.01 (ref 1.005–1.030)
Squamous Epithelial / HPF: NONE SEEN /HPF (ref 0–5)
pH: 5 (ref 5.0–8.0)

## 2022-12-17 LAB — URINE DRUG SCREEN, QUALITATIVE (ARMC ONLY)
Amphetamines, Ur Screen: POSITIVE — AB
Barbiturates, Ur Screen: NOT DETECTED
Benzodiazepine, Ur Scrn: POSITIVE — AB
Cannabinoid 50 Ng, Ur ~~LOC~~: NOT DETECTED
Cocaine Metabolite,Ur ~~LOC~~: NOT DETECTED
MDMA (Ecstasy)Ur Screen: NOT DETECTED
Methadone Scn, Ur: NOT DETECTED
Opiate, Ur Screen: NOT DETECTED
Phencyclidine (PCP) Ur S: NOT DETECTED
Tricyclic, Ur Screen: NOT DETECTED

## 2022-12-17 LAB — SARS CORONAVIRUS 2 BY RT PCR: SARS Coronavirus 2 by RT PCR: NEGATIVE

## 2022-12-17 LAB — GLUCOSE, CAPILLARY: Glucose-Capillary: 138 mg/dL — ABNORMAL HIGH (ref 70–99)

## 2022-12-17 LAB — CK: Total CK: 566 U/L — ABNORMAL HIGH (ref 49–397)

## 2022-12-17 LAB — ETHANOL: Alcohol, Ethyl (B): <10 mg/dL (ref <10)

## 2022-12-17 LAB — MRSA NEXT GEN BY PCR, NASAL: MRSA by PCR Next Gen: DETECTED — AB

## 2022-12-17 LAB — HIV ANTIBODY (ROUTINE TESTING W REFLEX): HIV Screen 4th Generation wRfx: NONREACTIVE

## 2022-12-17 MED ORDER — ONDANSETRON HCL 4 MG/2ML IJ SOLN: 4.0000 mg | Freq: Four times a day (QID) | INTRAMUSCULAR | Status: DC | PRN

## 2022-12-17 MED ORDER — SODIUM CHLORIDE 0.9% FLUSH
3.0000 mL | Freq: Two times a day (BID) | INTRAVENOUS | Status: DC
  Administered 2022-12-17 – 2022-12-19 (×5): 3 mL via INTRAVENOUS

## 2022-12-17 MED ORDER — SODIUM CHLORIDE 0.9 % IV SOLN
1.0000 g | INTRAVENOUS | Status: AC
  Administered 2022-12-17: 1 g via INTRAVENOUS
  Filled 2022-12-17: qty 10

## 2022-12-17 MED ORDER — SODIUM BICARBONATE 8.4 % IV SOLN
INTRAVENOUS | Status: DC
  Filled 2022-12-17: qty 1000
  Filled 2022-12-17: qty 150
  Filled 2022-12-17 (×3): qty 1000
  Filled 2022-12-17: qty 150

## 2022-12-17 MED ORDER — LACTATED RINGERS IV BOLUS
1000.0000 mL | Freq: Once | INTRAVENOUS | Status: AC
  Administered 2022-12-17: 1000 mL via INTRAVENOUS

## 2022-12-17 MED ORDER — SODIUM CHLORIDE 0.9 % IV SOLN: 250.0000 mL | INTRAVENOUS | Status: DC | PRN

## 2022-12-17 MED ORDER — MIDAZOLAM HCL 2 MG/2ML IJ SOLN
2.0000 mg | Freq: Once | INTRAMUSCULAR | Status: AC
  Administered 2022-12-17: 2 mg via INTRAVENOUS
  Filled 2022-12-17: qty 2

## 2022-12-17 MED ORDER — DOCUSATE SODIUM 100 MG PO CAPS: 100.0000 mg | ORAL_CAPSULE | Freq: Two times a day (BID) | ORAL | Status: DC | PRN

## 2022-12-17 MED ORDER — IPRATROPIUM-ALBUTEROL 0.5-2.5 (3) MG/3ML IN SOLN
RESPIRATORY_TRACT | Status: AC
  Filled 2022-12-17: qty 3

## 2022-12-17 MED ORDER — METRONIDAZOLE 500 MG/100ML IV SOLN
500.0000 mg | Freq: Once | INTRAVENOUS | Status: AC
  Administered 2022-12-17: 500 mg via INTRAVENOUS
  Filled 2022-12-17: qty 100

## 2022-12-17 MED ORDER — POLYETHYLENE GLYCOL 3350 17 G PO PACK: 17.0000 g | PACK | Freq: Every day | ORAL | Status: DC | PRN

## 2022-12-17 MED ORDER — MUPIROCIN 2 % EX OINT
1.0000 | TOPICAL_OINTMENT | Freq: Two times a day (BID) | CUTANEOUS | Status: DC
  Administered 2022-12-17 – 2022-12-19 (×4): 1 via NASAL
  Filled 2022-12-17: qty 22

## 2022-12-17 MED ORDER — CHLORHEXIDINE GLUCONATE CLOTH 2 % EX PADS
6.0000 | MEDICATED_PAD | Freq: Every day | CUTANEOUS | Status: DC
  Administered 2022-12-17: 6 via TOPICAL

## 2022-12-17 MED ORDER — SODIUM CHLORIDE 0.9 % IV BOLUS (SEPSIS): 1000.0000 mL | Freq: Once | INTRAVENOUS | Status: DC

## 2022-12-17 MED ORDER — SODIUM CHLORIDE 0.9 % IV BOLUS
1000.0000 mL | Freq: Once | INTRAVENOUS | Status: AC
  Administered 2022-12-17: 1000 mL via INTRAVENOUS

## 2022-12-17 MED ORDER — SODIUM CHLORIDE 0.9% FLUSH: 3.0000 mL | INTRAVENOUS | Status: DC | PRN

## 2022-12-17 MED ORDER — HEPARIN SODIUM (PORCINE) 5000 UNIT/ML IJ SOLN
5000.0000 [IU] | Freq: Three times a day (TID) | INTRAMUSCULAR | Status: DC
  Administered 2022-12-17 – 2022-12-19 (×6): 5000 [IU] via SUBCUTANEOUS
  Filled 2022-12-17 (×6): qty 1

## 2022-12-17 MED ORDER — CHLORHEXIDINE GLUCONATE CLOTH 2 % EX PADS
6.0000 | MEDICATED_PAD | Freq: Every day | CUTANEOUS | Status: DC
  Administered 2022-12-19: 6 via TOPICAL

## 2022-12-17 MED ORDER — VANCOMYCIN HCL 2000 MG/400ML IV SOLN
2000.0000 mg | Freq: Once | INTRAVENOUS | Status: AC
  Administered 2022-12-17: 2000 mg via INTRAVENOUS
  Filled 2022-12-17: qty 400

## 2022-12-17 MED ORDER — LORAZEPAM 2 MG/ML IJ SOLN
2.0000 mg | Freq: Once | INTRAMUSCULAR | Status: AC
  Administered 2022-12-17: 2 mg via INTRAVENOUS
  Filled 2022-12-17: qty 1

## 2022-12-17 MED ORDER — ACETAMINOPHEN 325 MG PO TABS
650.0000 mg | ORAL_TABLET | ORAL | Status: DC | PRN
  Administered 2022-12-18: 650 mg via ORAL
  Filled 2022-12-17: qty 2

## 2022-12-17 MED ORDER — DROPERIDOL 2.5 MG/ML IJ SOLN
5.0000 mg | Freq: Once | INTRAMUSCULAR | Status: AC
  Administered 2022-12-17: 5 mg via INTRAVENOUS
  Filled 2022-12-17 (×2): qty 2

## 2022-12-17 NOTE — ED Provider Notes (Signed)
-----------------------------------------   8:30 AM on 12/17/2022 ----------------------------------------- Patient care assumed from Dr. York Cerise.  Patient has just returned from CT scan.  Patient is now sleeping, satting 96 to 98% on room air.  Patient's CT scan I have reviewed appear to show a significant amount of pneumomediastinum with gas extending into the soft tissues of the chest neck and shoulders.  Awaiting radiology read.  Patient's lab work otherwise shows significant leukocytosis of 36,000 appears to be in acute renal failure with a creatinine of 4.3 and anion gap of 28.  No history provided of any vomiting episodes however patient did admit to substance use including methamphetamine and alcohol.  Given the patient's significant leukocytosis and initial x-ray findings patient is already receiving IV antibiotics.  Given the degree of pneumomediastinum on CT imaging we will discuss with cardiothoracic surgery at [facility] to see if the patient will require transfer versus local admission for continued IV antibiotics.  I spoke with Dr. Dorris Fetch of cardiothoracic surgery who has reviewed the CT images.  States nothing obvious from a surgical standpoint at this time, but does recommend getting a Gastrografin study once we are able.  Currently the patient is quite sedated he will moan and move to sternal rub but otherwise would not be able to perform a swallow study adequately at this time.  I spoke with Dr.Kasa the intensivist on-call who agreed to admit him to his service.  States he would like CTs of the CT and L-spine as well as a precaution to rule out any abscess.  We will also perform a COVID swab.  We will send blood cultures and lactic acid.  We will IV hydrate.  Patient has received IV Rocephin Flagyl and vancomycin.  Patient will be admitted to the intensive care unit.  CRITICAL CARE Performed by: Minna Antis   Total critical care time: 30 minutes  Critical care time  was exclusive of separately billable procedures and treating other patients.  Critical care was necessary to treat or prevent imminent or life-threatening deterioration.  Critical care was time spent personally by me on the following activities: development of treatment plan with patient and/or surrogate as well as nursing, discussions with consultants, evaluation of patient's response to treatment, examination of patient, obtaining history from patient or surrogate, ordering and performing treatments and interventions, ordering and review of laboratory studies, ordering and review of radiographic studies, pulse oximetry and re-evaluation of patient's condition.    Minna Antis, MD 12/17/22 [phone]

## 2022-12-17 NOTE — ED Notes (Signed)
Patient placed on 3lnc.

## 2022-12-17 NOTE — Consult Note (Signed)
PHARMACY CONSULT NOTE - FOLLOW UP  Pharmacy Consult for Electrolyte Monitoring and Replacement   Recent Labs: Potassium (mmol/L)  Date Value  12/17/2022 3.8   Magnesium (mg/dL)  Date Value  16/03/9603 2.2   Calcium (mg/dL)  Date Value  54/02/8118 10.3   Albumin (g/dL)  Date Value  14/78/2956 5.4 (H)   Phosphorus (mg/dL)  Date Value  21/30/8657 6.9 (H)   Sodium (mmol/L)  Date Value  12/17/2022 139     Assessment:  32 y.o. male who presents by private vehicle for evaluation of acute dyspnea.   Fluids: NaBicarb @ 125 ml/hr.   Goal of Therapy:  WNL  Plan:  No replacement needed.  F/u with AM labs.   Ronnald Ramp ,PharmD Clinical Pharmacist 12/17/2022 9:48 AM

## 2022-12-17 NOTE — ED Notes (Signed)
Advised nurse that patient has ready bed 

## 2022-12-17 NOTE — Progress Notes (Signed)
PHARMACY -  BRIEF ANTIBIOTIC NOTE   Pharmacy has received consult(s) for Vancomycin from an ED provider.  The patient's profile has been reviewed for ht/wt/allergies/indication/available labs.    One time order(s) placed for Vancomycin 2 gm per pt wt: 97.5 kg  Further antibiotics/pharmacy consults should be ordered by admitting physician if indicated.                       Thank you, Otelia Sergeant, PharmD, MBA 12/17/2022 7:00 AM

## 2022-12-17 NOTE — ED Notes (Signed)
Pt sleeping. 

## 2022-12-17 NOTE — ED Notes (Signed)
Received report from night RN. Pt appears disheveled and restless, with dirt on legs and clothes. Pt less restless after droperidol given for T. Pending urine. Urinal provided. Pt appears slightly tachypneic with unlabored respirations.
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
## 2022-12-18 ENCOUNTER — Inpatient Hospital Stay: Payer: MEDICAID

## 2022-12-18 DIAGNOSIS — J982 Interstitial emphysema: Secondary | ICD-10-CM

## 2022-12-18 DIAGNOSIS — N179 Acute kidney failure, unspecified: Secondary | ICD-10-CM

## 2022-12-18 DIAGNOSIS — F1510 Other stimulant abuse, uncomplicated: Secondary | ICD-10-CM

## 2022-12-18 DIAGNOSIS — R0600 Dyspnea, unspecified: Secondary | ICD-10-CM

## 2022-12-18 LAB — RENAL FUNCTION PANEL
Albumin: 3.3 g/dL — ABNORMAL LOW (ref 3.5–5.0)
Anion gap: 10 (ref 5–15)
BUN: 38 mg/dL — ABNORMAL HIGH (ref 6–20)
CO2: 24 mmol/L (ref 22–32)
Calcium: 8.2 mg/dL — ABNORMAL LOW (ref 8.9–10.3)
Chloride: 99 mmol/L (ref 98–111)
Creatinine, Ser: 1.74 mg/dL — ABNORMAL HIGH (ref 0.61–1.24)
GFR, Estimated: 52 mL/min — ABNORMAL LOW (ref >60)
Glucose, Bld: 113 mg/dL — ABNORMAL HIGH (ref 70–99)
Phosphorus: 2.6 mg/dL (ref 2.5–4.6)
Potassium: 3.4 mmol/L — ABNORMAL LOW (ref 3.5–5.1)
Sodium: 133 mmol/L — ABNORMAL LOW (ref 135–145)

## 2022-12-18 LAB — MAGNESIUM: Magnesium: 2.4 mg/dL (ref 1.7–2.4)

## 2022-12-18 LAB — CBC
HCT: 37.6 % — ABNORMAL LOW (ref 39.0–52.0)
Hemoglobin: 12.7 g/dL — ABNORMAL LOW (ref 13.0–17.0)
MCH: 28.2 pg (ref 26.0–34.0)
MCHC: 33.8 g/dL (ref 30.0–36.0)
MCV: 83.6 fL (ref 80.0–100.0)
Platelets: 353 10*3/uL (ref 150–400)
RBC: 4.5 MIL/uL (ref 4.22–5.81)
RDW: 13.3 % (ref 11.5–15.5)
WBC: 13.9 10*3/uL — ABNORMAL HIGH (ref 4.0–10.5)
nRBC: 0 % (ref 0.0–0.2)

## 2022-12-18 LAB — LACTIC ACID, PLASMA: Lactic Acid, Venous: 1.1 mmol/L (ref 0.5–1.9)

## 2022-12-18 LAB — CULTURE, BLOOD (ROUTINE X 2)

## 2022-12-18 MED ORDER — SODIUM CHLORIDE 0.9 % IV SOLN: INTRAVENOUS | Status: DC

## 2022-12-18 MED ORDER — LOPERAMIDE HCL 2 MG PO CAPS
2.0000 mg | ORAL_CAPSULE | Freq: Four times a day (QID) | ORAL | Status: DC | PRN
  Administered 2022-12-18: 2 mg via ORAL
  Filled 2022-12-18: qty 1

## 2022-12-18 MED ORDER — ORAL CARE MOUTH RINSE: 15.0000 mL | OROMUCOSAL | Status: DC | PRN

## 2022-12-18 MED ORDER — POTASSIUM CHLORIDE CRYS ER 20 MEQ PO TBCR
40.0000 meq | EXTENDED_RELEASE_TABLET | Freq: Once | ORAL | Status: AC
  Administered 2022-12-18: 40 meq via ORAL
  Filled 2022-12-18: qty 2

## 2022-12-18 MED ORDER — IOHEXOL 300 MG/ML  SOLN
150.0000 mL | Freq: Once | INTRAMUSCULAR | Status: AC | PRN
  Administered 2022-12-18: 150 mL via ORAL

## 2022-12-18 NOTE — Progress Notes (Signed)
[facility] ADULT ICU REPLACEMENT PROTOCOL   The patient does apply for the [facility] Adult ICU Electrolyte Replacment Protocol based on the criteria listed below:   1.Exclusion criteria: TCTS, ECMO, Dialysis, and Myasthenia Gravis patients 2. Is GFR >/= 30 ml/min? Yes.    Patient's GFR today is 52 3. Is SCr </= 2? Yes.   Patient's SCr is 1.74 mg/dL 4. Did SCr increase >/= 0.5 in 24 hours? No. 5.Pt's weight >40kg  Yes.   6. Abnormal electrolyte(s): K  7. Electrolytes replaced per protocol 8.  Call MD STAT for K+ </= 2.5, Phos </= 1, or Mag </= 1 Physician:  Seth Bake 12/18/2022 5:39 AM
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
## 2022-12-19 DIAGNOSIS — E872 Acidosis, unspecified: Secondary | ICD-10-CM | POA: Diagnosis not present

## 2022-12-19 LAB — BASIC METABOLIC PANEL
Anion gap: 6 (ref 5–15)
BUN: 33 mg/dL — ABNORMAL HIGH (ref 6–20)
CO2: 21 mmol/L — ABNORMAL LOW (ref 22–32)
Calcium: 8.7 mg/dL — ABNORMAL LOW (ref 8.9–10.3)
Chloride: 105 mmol/L (ref 98–111)
Creatinine, Ser: 1.17 mg/dL (ref 0.61–1.24)
GFR, Estimated: >60 mL/min (ref >60)
Glucose, Bld: 106 mg/dL — ABNORMAL HIGH (ref 70–99)
Potassium: 4.1 mmol/L (ref 3.5–5.1)
Sodium: 132 mmol/L — ABNORMAL LOW (ref 135–145)

## 2022-12-19 LAB — CULTURE, BLOOD (ROUTINE X 2)
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
## 2022-12-19 NOTE — Progress Notes (Signed)
1305-D/c instructions discussed with pt. Pt had no questions at this time. Pt  sts he is waiting on his friend to come and pick him up. Pt v/s are stable and pt has no c/o pain or discomfort at this time.

## 2022-12-19 NOTE — TOC Initial Note (Signed)
Transition of Care [facility]) - Initial/Assessment Note    Patient Details  Name: [name] MRN: [id number] Date of Birth: 04/06/1991  [facility] CM/SW Contact:    Kreg Shropshire, RN Phone Number: 12/19/2022, 10:50 AM  Clinical Narrative:                 Cm assessed pt for TOC needs. Pt does not have PCP. Added [employer] website and phone number to his AVS to find a new PCP. Pt declined resources for substance abuse at this time.  Expected Discharge Plan: Home/Self Care     Patient Goals and CMS Choice            Expected Discharge Plan and Services                                              Prior Living Arrangements/Services                       Activities of Daily Living Home Assistive Devices/Equipment: Cane (specify quad or straight) (straight; occasional use) ADL Screening (condition at time of admission) Patient's cognitive ability adequate to safely complete daily activities?: Yes Is the patient deaf or have difficulty hearing?: No Does the patient have difficulty seeing, even when wearing glasses/contacts?: No Does the patient have difficulty concentrating, remembering, or making decisions?: No Patient able to express need for assistance with ADLs?: Yes Does the patient have difficulty dressing or bathing?: No Independently performs ADLs?: Yes (appropriate for developmental age) Does the patient have difficulty walking or climbing stairs?: No Weakness of Legs: None Weakness of Arms/Hands: None  Permission Sought/Granted                  Emotional Assessment Appearance:: Disheveled Attitude/Demeanor/Rapport: Engaged Affect (typically observed): Calm Orientation: : Oriented to Self, Oriented to Place, Oriented to  Time, Oriented to Situation      Admission diagnosis:  Pneumomediastinum (HCC) [J98.2] Metabolic acidosis [E87.20] Methamphetamine abuse (HCC) [F15.10] Acute dyspnea [R06.00] Acute renal  failure, unspecified acute renal failure type [facility]) [N17.9] Patient Active Problem List   Diagnosis Date Noted   Methamphetamine abuse (HCC) 12/18/2022   Pneumomediastinum (HCC) 12/18/2022   Acute dyspnea 12/18/2022   Metabolic acidosis 12/17/2022   AMS (altered mental status) 10/01/2021   Rhabdomyolysis 10/01/2021   Transaminitis 10/01/2021   Cellulitis of foot 02/20/2016   Nasal congestion 01/31/2012   Dry cough 01/31/2012   Tobacco abuse 01/31/2012   Heartburn 01/30/2012   Maculopapular rash, generalized 01/29/2012   Generalized muscle weakness 01/29/2012   Acute renal failure (HCC) 01/29/2012   Elevated CK 01/29/2012   Hypermagnesemia 01/29/2012   Hyperphosphatemia 01/29/2012   PCP:  Default, Provider, MD Pharmacy:   [id number] - [place] ([place]), [place] - [address] [place] ([place]) [place] [address] Phone: [phone] Fax: [phone]     Social Determinants of Health (SDOH) Social History: SDOH Screenings   Food Insecurity: No Food Insecurity (12/17/2022)  Housing: Medium Risk (12/17/2022)  Transportation Needs: Unmet Transportation Needs (12/17/2022)  Utilities: Not At Risk (12/17/2022)  Tobacco Use: High Risk (12/17/2022)   SDOH Interventions:     Readmission Risk Interventions     No data to display

## 2022-12-19 NOTE — Discharge Instructions (Addendum)
Abstain from doing drugs and drinking alcohol Pt advised to set up PCP in the area  Please visit website or call for Primary Care Providers who take [facility].    [phone]  [email or web address]

## 2022-12-20 LAB — CULTURE, BLOOD (ROUTINE X 2)

## 2022-12-21 LAB — CULTURE, BLOOD (ROUTINE X 2)
Culture: NO GROWTH
Culture: NO GROWTH

## 2022-12-22 LAB — CULTURE, BLOOD (ROUTINE X 2)
Special Requests: ADEQUATE
Special Requests: ADEQUATE

## 2023-01-08 ENCOUNTER — Emergency Department: Payer: MEDICAID

## 2023-01-08 ENCOUNTER — Other Ambulatory Visit: Payer: Self-pay

## 2023-01-08 ENCOUNTER — Inpatient Hospital Stay: Payer: MEDICAID

## 2023-01-08 ENCOUNTER — Inpatient Hospital Stay
Admission: EM | Admit: 2023-01-08 | Discharge: 2023-01-24 | DRG: 560 | Disposition: A | Discharge status: Home / Self Care | Payer: MEDICAID | Attending: Internal Medicine | Admitting: Internal Medicine

## 2023-01-08 DIAGNOSIS — F1010 Alcohol abuse, uncomplicated: Secondary | ICD-10-CM | POA: Diagnosis present

## 2023-01-08 DIAGNOSIS — E860 Dehydration: Secondary | ICD-10-CM | POA: Diagnosis present

## 2023-01-08 DIAGNOSIS — R21 Rash and other nonspecific skin eruption: Secondary | ICD-10-CM | POA: Diagnosis present

## 2023-01-08 DIAGNOSIS — E876 Hypokalemia: Secondary | ICD-10-CM | POA: Diagnosis present

## 2023-01-08 DIAGNOSIS — R030 Elevated blood-pressure reading, without diagnosis of hypertension: Secondary | ICD-10-CM | POA: Diagnosis present

## 2023-01-08 DIAGNOSIS — Z8249 Family history of ischemic heart disease and other diseases of the circulatory system: Secondary | ICD-10-CM

## 2023-01-08 DIAGNOSIS — E861 Hypovolemia: Secondary | ICD-10-CM | POA: Diagnosis present

## 2023-01-08 DIAGNOSIS — F1310 Sedative, hypnotic or anxiolytic abuse, uncomplicated: Secondary | ICD-10-CM | POA: Diagnosis present

## 2023-01-08 DIAGNOSIS — Z720 Tobacco use: Secondary | ICD-10-CM | POA: Diagnosis present

## 2023-01-08 DIAGNOSIS — F17210 Nicotine dependence, cigarettes, uncomplicated: Secondary | ICD-10-CM | POA: Diagnosis present

## 2023-01-08 DIAGNOSIS — Y831 Surgical operation with implant of artificial internal device as the cause of abnormal reaction of the patient, or of later complication, without mention of misadventure at the time of the procedure: Secondary | ICD-10-CM | POA: Diagnosis present

## 2023-01-08 DIAGNOSIS — M79605 Pain in left leg: Secondary | ICD-10-CM | POA: Diagnosis present

## 2023-01-08 DIAGNOSIS — R12 Heartburn: Secondary | ICD-10-CM | POA: Diagnosis present

## 2023-01-08 DIAGNOSIS — M6282 Rhabdomyolysis: Secondary | ICD-10-CM | POA: Diagnosis present

## 2023-01-08 DIAGNOSIS — E871 Hypo-osmolality and hyponatremia: Secondary | ICD-10-CM | POA: Diagnosis present

## 2023-01-08 DIAGNOSIS — R7401 Elevation of levels of liver transaminase levels: Secondary | ICD-10-CM | POA: Diagnosis present

## 2023-01-08 DIAGNOSIS — K219 Gastro-esophageal reflux disease without esophagitis: Secondary | ICD-10-CM | POA: Diagnosis present

## 2023-01-08 DIAGNOSIS — R748 Abnormal levels of other serum enzymes: Secondary | ICD-10-CM | POA: Diagnosis present

## 2023-01-08 DIAGNOSIS — M25552 Pain in left hip: Principal | ICD-10-CM | POA: Diagnosis present

## 2023-01-08 DIAGNOSIS — R29898 Other symptoms and signs involving the musculoskeletal system: Secondary | ICD-10-CM

## 2023-01-08 DIAGNOSIS — T84011A Broken internal left hip prosthesis, initial encounter: Secondary | ICD-10-CM | POA: Diagnosis present

## 2023-01-08 DIAGNOSIS — F1510 Other stimulant abuse, uncomplicated: Secondary | ICD-10-CM | POA: Diagnosis present

## 2023-01-08 DIAGNOSIS — Z5982 Transportation insecurity: Secondary | ICD-10-CM | POA: Diagnosis not present

## 2023-01-08 DIAGNOSIS — M1612 Unilateral primary osteoarthritis, left hip: Secondary | ICD-10-CM | POA: Diagnosis present

## 2023-01-08 LAB — CBC
HCT: 38.2 % — ABNORMAL LOW (ref 39.0–52.0)
Hemoglobin: 13.4 g/dL (ref 13.0–17.0)
MCH: 28.2 pg (ref 26.0–34.0)
MCHC: 35.1 g/dL (ref 30.0–36.0)
MCV: 80.4 fL (ref 80.0–100.0)
Platelets: 297 10*3/uL (ref 150–400)
RBC: 4.75 MIL/uL (ref 4.22–5.81)
RDW: 13 % (ref 11.5–15.5)
WBC: 13.7 10*3/uL — ABNORMAL HIGH (ref 4.0–10.5)
nRBC: 0 % (ref 0.0–0.2)

## 2023-01-08 LAB — COMPREHENSIVE METABOLIC PANEL
ALT: 24 U/L (ref 0–44)
AST: 26 U/L (ref 15–41)
Albumin: 3.6 g/dL (ref 3.5–5.0)
Alkaline Phosphatase: 50 U/L (ref 38–126)
Anion gap: 11 (ref 5–15)
BUN: 15 mg/dL (ref 6–20)
CO2: 24 mmol/L (ref 22–32)
Calcium: 9.2 mg/dL (ref 8.9–10.3)
Chloride: 96 mmol/L — ABNORMAL LOW (ref 98–111)
Creatinine, Ser: 0.8 mg/dL (ref 0.61–1.24)
GFR, Estimated: >60 mL/min (ref >60)
Glucose, Bld: 111 mg/dL — ABNORMAL HIGH (ref 70–99)
Potassium: 3.2 mmol/L — ABNORMAL LOW (ref 3.5–5.1)
Sodium: 131 mmol/L — ABNORMAL LOW (ref 135–145)
Total Bilirubin: 0.9 mg/dL (ref 0.3–1.2)
Total Protein: 7.1 g/dL (ref 6.5–8.1)

## 2023-01-08 LAB — CK: Total CK: 141 U/L (ref 49–397)

## 2023-01-08 LAB — SEDIMENTATION RATE: Sed Rate: 68 mm/hr — ABNORMAL HIGH (ref 0–15)

## 2023-01-08 LAB — ETHANOL: Alcohol, Ethyl (B): <10 mg/dL (ref <10)

## 2023-01-08 MED ORDER — METHOCARBAMOL 1000 MG/10ML IJ SOLN: 1000.0000 mg | Freq: Once | INTRAMUSCULAR | Status: DC

## 2023-01-08 MED ORDER — ACETAMINOPHEN 650 MG RE SUPP: 650.0000 mg | Freq: Four times a day (QID) | RECTAL | Status: DC | PRN

## 2023-01-08 MED ORDER — HEPARIN SODIUM (PORCINE) 5000 UNIT/ML IJ SOLN
5000.0000 [IU] | Freq: Three times a day (TID) | INTRAMUSCULAR | Status: DC
  Administered 2023-01-08 – 2023-01-10 (×6): 5000 [IU] via SUBCUTANEOUS
  Filled 2023-01-08 (×7): qty 1

## 2023-01-08 MED ORDER — HYDROCODONE-ACETAMINOPHEN 5-325 MG PO TABS
1.0000 | ORAL_TABLET | Freq: Once | ORAL | Status: AC
  Administered 2023-01-08: 1 via ORAL
  Filled 2023-01-08: qty 1

## 2023-01-08 MED ORDER — HYDROCODONE-ACETAMINOPHEN 5-325 MG PO TABS
1.0000 | ORAL_TABLET | ORAL | Status: DC | PRN
  Administered 2023-01-08 – 2023-01-09 (×2): 1 via ORAL
  Filled 2023-01-08 (×3): qty 1

## 2023-01-08 MED ORDER — PANTOPRAZOLE SODIUM 40 MG IV SOLR
40.0000 mg | Freq: Two times a day (BID) | INTRAVENOUS | Status: DC
  Administered 2023-01-08 – 2023-01-10 (×4): 40 mg via INTRAVENOUS
  Filled 2023-01-08 (×4): qty 10

## 2023-01-08 MED ORDER — ACETAMINOPHEN 325 MG PO TABS: 650.0000 mg | ORAL_TABLET | Freq: Four times a day (QID) | ORAL | Status: DC | PRN

## 2023-01-08 MED ORDER — THIAMINE HCL 100 MG/ML IJ SOLN
100.0000 mg | Freq: Every day | INTRAMUSCULAR | Status: DC
  Administered 2023-01-08 – 2023-01-10 (×3): 100 mg via INTRAVENOUS
  Filled 2023-01-08 (×3): qty 2

## 2023-01-08 MED ORDER — DIAZEPAM 5 MG/ML IJ SOLN
2.5000 mg | Freq: Once | INTRAMUSCULAR | Status: AC
  Administered 2023-01-08: 2.5 mg via INTRAVENOUS
  Filled 2023-01-08: qty 2

## 2023-01-08 MED ORDER — SODIUM CHLORIDE 0.9 % IV SOLN: INTRAVENOUS | Status: DC

## 2023-01-08 MED ORDER — METHOCARBAMOL 500 MG PO TABS
500.0000 mg | ORAL_TABLET | Freq: Once | ORAL | Status: AC
  Administered 2023-01-08: 500 mg via ORAL
  Filled 2023-01-08: qty 1

## 2023-01-08 MED ORDER — KETOROLAC TROMETHAMINE 30 MG/ML IJ SOLN
30.0000 mg | Freq: Once | INTRAMUSCULAR | Status: AC
  Administered 2023-01-08: 30 mg via INTRAMUSCULAR
  Filled 2023-01-08: qty 1

## 2023-01-08 MED ORDER — NICOTINE 21 MG/24HR TD PT24
21.0000 mg | MEDICATED_PATCH | Freq: Every day | TRANSDERMAL | Status: DC
  Administered 2023-01-08 – 2023-01-23 (×16): 21 mg via TRANSDERMAL
  Filled 2023-01-08 (×17): qty 1

## 2023-01-08 MED ORDER — SODIUM CHLORIDE 0.9% FLUSH
3.0000 mL | Freq: Two times a day (BID) | INTRAVENOUS | Status: DC
  Administered 2023-01-08 – 2023-01-14 (×13): 3 mL via INTRAVENOUS

## 2023-01-08 NOTE — Assessment & Plan Note (Signed)
Level pending. Thiamine 100 mg daily.  CIWA and seizure precaution.

## 2023-01-08 NOTE — Assessment & Plan Note (Signed)
We will do venous doppler./ MRI hip.  PRN pain control.

## 2023-01-08 NOTE — Assessment & Plan Note (Signed)
Resolving

## 2023-01-08 NOTE — Hospital Course (Addendum)
[name] is a 32 y.o. male with medical history significant for left hip pain so severe that he can't lift his left leg.  Patient does does have a history of drug abuse and alcohol abuse. He has a history of undergoing ORIF of the left acetabulum over 5 years ago at [facility].  Of note, was recently discharged on July 2 after having pneumomediastinum which was evaluated with CT chest and then an upper GI barium swallow which was negative for rupture or perforation, patient had acute kidney injury anion gap metabolic acidosis alcohol abuse altered mental status leukocytosis thrombocytosis attributed to reactive nature and polysubstance abuse.   07/22: Presented to the emergency room with complaint of left hip pain.  Admitted to hospitalist service. DVT scan was negative. 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  62/13: Seen by orthopedics, CT of the hip, patient was initially refusing CT but did end up getting it done. 07/24: Per Dr. Allena Katz, orthopedics, patient will require likely complicated left hip arthroplasty with orthopedic subspecialist possibly at [facility] or [facility].  Pain control remains a challenge. Somnolent w/ dilaudid. Scheduled oxycodone, APAP, naproxen 07/25: reduced dilaudid dose/frequency.  Increased po oxycodone dose. Goal to correct his underlying source of pain - given social issues and pain control challenge, he is not safe at this time for outpatient discharge and SNF will not take him if homeless. Ortho on call at [facility] not aware of anyone there who can perform the necessary surgery. Will reach out to [facility] and [facility] to try to transfer or otherwise arrange appropriate follow-up 07/26: long discussion w/ patient and his brother today - reviewed plan for ideally transfer but this is up to [facility], backup plan for taper off IV opiates and discharge to home/SNF to follow outpatient. At this  point, he cannot ambulate  07/27: [facility] unable to take him. Will reach out to [facility]   Consultants:  Orthopedic surgery  Procedures: None      ASSESSMENT & PLAN:   Principal Problem:   Acute leg pain, left Active Problems:   Rhabdomyolysis   Maculopapular rash, generalized   Hyponatremia   Hypokalemia   Heartburn   Elevated CK   Tobacco abuse   Alcohol abuse   Left hip pain  Acute left hip pain likely secondary to severe osteoarthritis in the setting of fractured hardware Severe degenerative changes of the left hip joint with remodeling of the acetabulum and femoral head x-ray of the hip showed moderate to severe posttraumatic degenerative change involving the left hip and one of the screws involving the medial plate is fractured also slight interval increase in the displacement of the fragment of the left fractured screw Continue PT and OT, likely patient may need rehab Pain control -scheduled oxycodone was increased, acetaminophen, naproxen; Dilaudid for breakthrough pain, try to minimize this Correcting his underlying source of pain is goal - will work on hospital to hospital transfer - given social issues and pain control challenge, he is not safe at this time for outpatient discharge to self-care, but SNF will not take him if homeless.  MRI negative for effusion, infection seems unlikely, trend CRP   Mild rhabdomyolysis -resolved D/c supplemental IV fluid   Alcohol abuse Continue thiamine 100 mg daily.  Continue CIWA and seizure precaution.    Polysubstance use disorder Urine toxicology obtain last month was positive for amphetamine and benzodiazepine Patient has  been counseled on cessation   Tobacco abuse Continue nicotine patch.    GERD Continue PPI therapy Aspiration precaution.    Hypokalemia Replace as needed Monitor BMP  Hyponatremia likely hypovolemic - improved  Monitor sodium levels closely D/c IVF   Maculopapular rash, generalized Resolved.         DVT prophylaxis: lovenox  Pertinent IV fluids/nutrition: No continuous IV fluids Central lines / invasive devices: None  Code Status: Full code ACP documentation reviewed: 01/10/2023, none on file  Current Admission Status: Inpatient TOC needs / Dispo plan: Uncertain whether we will be able to transfer him to tertiary care center for specialized surgical intervention, may need to discharge to SNF with outpatient follow-up Barriers to discharge / significant pending items: No significant medical concern at this time but orthopedic/pain control and appropriate follow-up are a challenge at this time, see note above

## 2023-01-08 NOTE — Assessment & Plan Note (Signed)
IV PPI. Aspiration precaution.

## 2023-01-08 NOTE — Assessment & Plan Note (Signed)
Recheck pending no falls reported.

## 2023-01-08 NOTE — ED Triage Notes (Addendum)
Pt to ED via EMS from home, pt c/o left hip pain.Pt states he was just walking around and then began to have a lot of pain. Pt denies injury.

## 2023-01-08 NOTE — ED Notes (Signed)
PO meds given for pain  States he know that is will not be strong enoiugh  Provider aware

## 2023-01-08 NOTE — Assessment & Plan Note (Addendum)
D/d include orthopedic related based on xray screw is fractured, we will request ortho consult. Mri ? Infection. Fall precaution. D/d include DVT: venous doppler. Aggressive rehab once etiology identified and addressed.

## 2023-01-08 NOTE — ED Notes (Signed)
Pt to MRI

## 2023-01-08 NOTE — ED Notes (Signed)
See triage note.Presents via EMS from home  States he developed pain to left hip  Unable to stand  States worse pain ever  Thinks he dislocated his hip  Tearful  Yelling out in pain

## 2023-01-08 NOTE — ED Notes (Signed)
Dr Cyril Loosen and this nurse tried to get pt up to ambulate  States he is not able to move his leg  Increased pain when he puts pressure on his leg and with it being lifted  States he is not able to move his leg

## 2023-01-08 NOTE — Assessment & Plan Note (Signed)
CPK pending.  No fall.
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
## 2023-01-08 NOTE — Assessment & Plan Note (Signed)
Sodium of 131. We will cont with MIVF at 50 cc/hour.

## 2023-01-08 NOTE — Assessment & Plan Note (Signed)
-  Nicotine patch 

## 2023-01-08 NOTE — ED Notes (Signed)
Dr Cyril Loosen in with pt   Pt moved to stretcher via slide board  Additional meds given  Pt is very diaphoretic

## 2023-01-08 NOTE — Discharge Instructions (Signed)

## 2023-01-08 NOTE — Assessment & Plan Note (Signed)
    Latest Ref Rng & Units 01/08/2023    4:03 PM 12/19/2022    4:19 AM 12/18/2022    4:02 AM  BMP  Glucose 70 - 99 mg/dL 960  454  098   BUN 6 - 20 mg/dL 15  33  38   Creatinine 0.61 - 1.24 mg/dL 1.19  1.47  8.29   Sodium 135 - 145 mmol/L 131  132  133   Potassium 3.5 - 5.1 mmol/L 3.2  4.1  3.4   Chloride 98 - 111 mmol/L 96  105  99   CO2 22 - 32 mmol/L 24  21  24    Calcium 8.9 - 10.3 mg/dL 9.2  8.7  8.2   Mild 2/2 due to decreased po intake no gi losses or med related.

## 2023-01-09 ENCOUNTER — Inpatient Hospital Stay: Payer: MEDICAID

## 2023-01-09 LAB — COMPREHENSIVE METABOLIC PANEL
ALT: 19 U/L (ref 0–44)
AST: 16 U/L (ref 15–41)
Albumin: 3.1 g/dL — ABNORMAL LOW (ref 3.5–5.0)
Alkaline Phosphatase: 45 U/L (ref 38–126)
Anion gap: 10 (ref 5–15)
BUN: 16 mg/dL (ref 6–20)
CO2: 24 mmol/L (ref 22–32)
Calcium: 8.5 mg/dL — ABNORMAL LOW (ref 8.9–10.3)
Chloride: 96 mmol/L — ABNORMAL LOW (ref 98–111)
Creatinine, Ser: 0.74 mg/dL (ref 0.61–1.24)
GFR, Estimated: >60 mL/min (ref >60)
Glucose, Bld: 128 mg/dL — ABNORMAL HIGH (ref 70–99)
Potassium: 3 mmol/L — ABNORMAL LOW (ref 3.5–5.1)
Sodium: 130 mmol/L — ABNORMAL LOW (ref 135–145)
Total Bilirubin: 0.8 mg/dL (ref 0.3–1.2)
Total Protein: 6.8 g/dL (ref 6.5–8.1)

## 2023-01-09 LAB — CBC
HCT: 34.1 % — ABNORMAL LOW (ref 39.0–52.0)
Hemoglobin: 12.1 g/dL — ABNORMAL LOW (ref 13.0–17.0)
MCH: 28.4 pg (ref 26.0–34.0)
MCHC: 35.5 g/dL (ref 30.0–36.0)
MCV: 80 fL (ref 80.0–100.0)
Platelets: 283 10*3/uL (ref 150–400)
RBC: 4.26 MIL/uL (ref 4.22–5.81)
RDW: 13.1 % (ref 11.5–15.5)
WBC: 13.9 10*3/uL — ABNORMAL HIGH (ref 4.0–10.5)
nRBC: 0 % (ref 0.0–0.2)

## 2023-01-09 MED ORDER — MORPHINE SULFATE (PF) 4 MG/ML IV SOLN
4.0000 mg | INTRAVENOUS | Status: DC | PRN
  Administered 2023-01-09 (×2): 4 mg via INTRAVENOUS
  Filled 2023-01-09 (×3): qty 1

## 2023-01-09 MED ORDER — HYDROCODONE-ACETAMINOPHEN 5-325 MG PO TABS
2.0000 | ORAL_TABLET | ORAL | Status: DC | PRN
  Administered 2023-01-09 – 2023-01-10 (×2): 2 via ORAL
  Filled 2023-01-09 (×3): qty 2

## 2023-01-09 MED ORDER — POTASSIUM CHLORIDE CRYS ER 20 MEQ PO TBCR
40.0000 meq | EXTENDED_RELEASE_TABLET | ORAL | Status: AC
  Administered 2023-01-09 (×2): 40 meq via ORAL
  Filled 2023-01-09 (×2): qty 2

## 2023-01-09 MED ORDER — HYDROMORPHONE HCL 1 MG/ML IJ SOLN
1.0000 mg | INTRAMUSCULAR | Status: DC | PRN
  Administered 2023-01-09 – 2023-01-11 (×10): 1 mg via INTRAVENOUS
  Filled 2023-01-09 (×10): qty 1

## 2023-01-09 MED ORDER — ALPRAZOLAM 1 MG PO TABS
1.0000 mg | ORAL_TABLET | Freq: Two times a day (BID) | ORAL | Status: DC | PRN
  Administered 2023-01-09 – 2023-01-11 (×4): 1 mg via ORAL
  Filled 2023-01-09 (×4): qty 1

## 2023-01-09 MED ORDER — NAPROXEN SODIUM 550 MG PO TABS
550.0000 mg | ORAL_TABLET | Freq: Two times a day (BID) | ORAL | Status: DC
  Filled 2023-01-09: qty 1

## 2023-01-09 MED ORDER — NAPROXEN 500 MG PO TABS
500.0000 mg | ORAL_TABLET | Freq: Two times a day (BID) | ORAL | Status: AC
  Administered 2023-01-09 – 2023-01-10 (×3): 500 mg via ORAL
  Filled 2023-01-09 (×3): qty 1

## 2023-01-09 NOTE — Progress Notes (Signed)
Progress Note   Patient: [name] ION:[id number] DOB: [birth year] DOA: 01/08/2023     1 DOS: the patient was seen and examined on 01/09/2023     Subjective:  Patient seen and examined at bedside this morning Still complaining of persistent hip pain despite several IV pain medications Denies nausea vomiting chest pain cough or urinary complaint    Brief hospital course: From HPI "[name] is a 32 y.o. male with medical history significant for left hip pain so severe that he can't lift his left leg.  Patient does does have a history of drug abuse and alcohol abuse and was recently discharged on July 2 after having pneumomediastinum which was evaluated with CT chest and then an upper GI barium swallow which was negative for rupture or perforation, patient had acute kidney injury anion gap metabolic acidosis alcohol abuse altered mental status leukocytosis thrombocytosis attributed to reactive nature and polysubstance abuse.  Presented to the emergency room with complaint of left hip pain.  Imaging of the left hip results as shown below"  Assessment and Plan: Acute left hip pain likely secondary to severe osteoarthritis in the setting of fractured hardware x-ray of the hip showed moderate to severe posttraumatic degenerative change involving the left hip and one of the screws involving the medial plate is fractured also slight interval increase in the displacement of the fragment of the left fractured screw Continue PT and OT, likely patient may need rehab I have discussed the case with orthopedic surgeon Dr. Allena Katz Who has recommended to obtain CT scan of the left hip to better assess hardware fracture. Patient however is refusing to have imaging done and have been requesting for pain medication instead. He has received several pain medications including morphine, Dilaudid, naproxen, Tylenol.  Mild rhabdomyolysis Continue supplemental IV fluid Monitor CPK levels closely  Left hip pain We  will do venous doppler./ MRI hip.  PRN pain control.      Alcohol abuse Continue thiamine 100 mg daily.  Continue CIWA and seizure precaution.    Polysubstance use disorder Urine toxicology obtain last month was positive for amphetamine and benzodiazepine Patient has been counseled on cessation  Tobacco abuse Continue nicotine patch.   GERD Continue PPI therapy Aspiration precaution.    Hypokalemia Potassium noted to be 3.0 today Continue potassium replacement therapy  Hyponatremia likely hypovolemic Presented with sodium 131 Continue current IV fluid resuscitation Monitor sodium levels closely   Maculopapular rash, generalized Resolving.    DVT prophylaxis: Continue heparin    Consults: Orthopedics Advance Care Planning: Full code   Family Communication: None present at bedside   Disposition Plan:  Back to previous home environment   Physical Exam:  General: Laying in bed complaining of pain Atraumatic normocephalic Pulmonary: Clear to auscultation bilaterally Abdominal: Nontender no masses palpable Musculoskeletal:    No deformity or edema Skin:Skin is warm.  Neurological: Awake alert and oriented Psychiatric: Anxious  Vitals:   01/08/23 2101 01/08/23 2226 01/09/23 0816 01/09/23 1157  BP:  (!) 150/100 (!) 158/97 (!) 146/95  Pulse:  (!) 105 85 (!) 105  Resp:  20 15 16   Temp: 98.4 F (36.9 C) 98.4 F (36.9 C) 98.1 F (36.7 C) 98.5 F (36.9 C)  TempSrc: Oral     SpO2:  100% 100% 100%  Weight:      Height:        Data Reviewed: I have reviewed the patient's MRI of the left hip results showing findings of arthritis, MRI of the brain  showing no acute intracranial pathology chest x-ray which is normal x-ray of the hip showed moderate to severe posttraumatic degenerative change involving the left hip and one of the screws involving the medial malleolus plate is fractured also slight interval increase in the displacement of the fragment of the left  fractured screw,.  Reviewed patient's labs, vitals, previous records as well as orthopedic documentation  Author: Loyce Dys, MD 01/09/2023 2:59 PM  For on call review [email or web address].
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
## 2023-01-09 NOTE — Plan of Care (Signed)

## 2023-01-10 DIAGNOSIS — M79605 Pain in left leg: Secondary | ICD-10-CM | POA: Diagnosis not present

## 2023-01-10 LAB — BASIC METABOLIC PANEL
Anion gap: 4 — ABNORMAL LOW (ref 5–15)
BUN: 13 mg/dL (ref 6–20)
CO2: 25 mmol/L (ref 22–32)
Calcium: 8.3 mg/dL — ABNORMAL LOW (ref 8.9–10.3)
Chloride: 105 mmol/L (ref 98–111)
Creatinine, Ser: 0.62 mg/dL (ref 0.61–1.24)
GFR, Estimated: >60 mL/min (ref >60)
Glucose, Bld: 107 mg/dL — ABNORMAL HIGH (ref 70–99)
Potassium: 3.4 mmol/L — ABNORMAL LOW (ref 3.5–5.1)
Sodium: 134 mmol/L — ABNORMAL LOW (ref 135–145)

## 2023-01-10 LAB — CBC WITH DIFFERENTIAL/PLATELET
Abs Immature Granulocytes: 0.12 10*3/uL — ABNORMAL HIGH (ref 0.00–0.07)
Basophils Absolute: 0 10*3/uL (ref 0.0–0.1)
Basophils Relative: 0 %
Eosinophils Absolute: 0 10*3/uL (ref 0.0–0.5)
Eosinophils Relative: 0 %
HCT: 34 % — ABNORMAL LOW (ref 39.0–52.0)
Hemoglobin: 11.9 g/dL — ABNORMAL LOW (ref 13.0–17.0)
Immature Granulocytes: 1 %
Lymphocytes Relative: 9 %
Lymphs Abs: 1.2 10*3/uL (ref 0.7–4.0)
MCH: 28.1 pg (ref 26.0–34.0)
MCHC: 35 g/dL (ref 30.0–36.0)
MCV: 80.4 fL (ref 80.0–100.0)
Monocytes Absolute: 1.3 10*3/uL — ABNORMAL HIGH (ref 0.1–1.0)
Monocytes Relative: 10 %
Neutro Abs: 11 10*3/uL — ABNORMAL HIGH (ref 1.7–7.7)
Neutrophils Relative %: 80 %
Platelets: 314 10*3/uL (ref 150–400)
RBC: 4.23 MIL/uL (ref 4.22–5.81)
RDW: 13 % (ref 11.5–15.5)
WBC: 13.7 10*3/uL — ABNORMAL HIGH (ref 4.0–10.5)
nRBC: 0 % (ref 0.0–0.2)

## 2023-01-10 LAB — CK: Total CK: 19 U/L — ABNORMAL LOW (ref 49–397)

## 2023-01-10 MED ORDER — POLYETHYLENE GLYCOL 3350 17 G PO PACK
17.0000 g | PACK | Freq: Every day | ORAL | Status: DC
  Administered 2023-01-11 – 2023-01-13 (×3): 17 g via ORAL
  Filled 2023-01-10 (×14): qty 1

## 2023-01-10 MED ORDER — ACETAMINOPHEN 500 MG PO TABS
500.0000 mg | ORAL_TABLET | ORAL | Status: AC
  Administered 2023-01-10 – 2023-01-12 (×12): 500 mg via ORAL
  Filled 2023-01-10 (×12): qty 1

## 2023-01-10 MED ORDER — POTASSIUM CHLORIDE 20 MEQ PO PACK
20.0000 meq | PACK | Freq: Two times a day (BID) | ORAL | Status: AC
  Administered 2023-01-10 (×2): 20 meq via ORAL
  Filled 2023-01-10 (×2): qty 1

## 2023-01-10 MED ORDER — SENNOSIDES-DOCUSATE SODIUM 8.6-50 MG PO TABS
2.0000 | ORAL_TABLET | Freq: Every day | ORAL | Status: DC
  Administered 2023-01-11 – 2023-01-13 (×3): 2 via ORAL
  Filled 2023-01-10 (×14): qty 2

## 2023-01-10 MED ORDER — ENOXAPARIN SODIUM 60 MG/0.6ML IJ SOSY
0.5000 mg/kg | PREFILLED_SYRINGE | INTRAMUSCULAR | Status: DC
  Administered 2023-01-10 – 2023-01-23 (×14): 50 mg via SUBCUTANEOUS
  Filled 2023-01-10 (×15): qty 0.6

## 2023-01-10 MED ORDER — OXYCODONE HCL 5 MG PO TABS
10.0000 mg | ORAL_TABLET | ORAL | Status: DC
  Administered 2023-01-10 – 2023-01-11 (×6): 10 mg via ORAL
  Filled 2023-01-10 (×6): qty 2

## 2023-01-10 MED ORDER — THIAMINE MONONITRATE 100 MG PO TABS
100.0000 mg | ORAL_TABLET | Freq: Every day | ORAL | Status: DC
  Administered 2023-01-11 – 2023-01-24 (×14): 100 mg via ORAL
  Filled 2023-01-10 (×14): qty 1

## 2023-01-10 MED ORDER — PANTOPRAZOLE SODIUM 40 MG PO TBEC
40.0000 mg | DELAYED_RELEASE_TABLET | Freq: Two times a day (BID) | ORAL | Status: DC
  Administered 2023-01-10 – 2023-01-24 (×28): 40 mg via ORAL
  Filled 2023-01-10 (×28): qty 1

## 2023-01-10 NOTE — Plan of Care (Signed)
  Problem: Education: Goal: Knowledge of General Education information will improve Description: Including pain rating scale, medication(s)/side effects and non-pharmacologic comfort measures Outcome: Progressing   Problem: Clinical Measurements: Goal: Will remain free from infection Outcome: Progressing   Problem: Activity: Goal: Risk for activity intolerance will decrease Outcome: Progressing   Problem: Nutrition: Goal: Adequate nutrition will be maintained Outcome: Progressing   Problem: Coping: Goal: Level of anxiety will decrease Outcome: Progressing   Problem: Pain Managment: Goal: General experience of comfort will improve Outcome: Progressing   Problem: Safety: Goal: Ability to remain free from injury will improve Outcome: Progressing   

## 2023-01-10 NOTE — Evaluation (Addendum)
Occupational Therapy Evaluation Patient Details Name: [name] MRN: [id number] DOB: 23-Jun-1990 Today's Date: 01/10/2023   History of Present Illness [name] is a 32 y.o. male with a history of drug use and ORIF left acetabulum over 5 years ago at [facility], now presenting with severe left hip degenerative changes. Radiographs today vs 2020 show possible change in screw position, currently in a possible intraarticular position.   Clinical Impression   [name] was seen for OT evaluation this date. Prior to hospital admission, pt was IND for I/ADLs and mobility. Pt states on d/c could go to his mother's house however she works and would not be available to provide any assistance.   Pt currently requires MIN A bridging at bed level for simulated bed pan use. MAX A for LB access at bed level, limited by 8/10 L hip pain with trunk flexion. MOD I to scoot up in bed; attempted sup<>long sitting however pt reports pain with greater than ~20* of trunk flexion. Pt premedicated for session however continues to be primarily limited by pain. Tolerates minimal P/AROM. Pt would benefit from skilled OT to address noted impairments and functional limitations (see below for any additional details). Upon hospital discharge, recommend follow up therapy services.   Recommendations for follow up therapy are one component of a multi-disciplinary discharge planning process, led by the attending physician.  Recommendations may be updated based on patient status, additional functional criteria and insurance authorization.   Assistance Recommended at Discharge Intermittent Supervision/Assistance  Patient can return home with the following A lot of help with walking and/or transfers;A lot of help with bathing/dressing/bathroom;Help with stairs or ramp for entrance    Functional Status Assessment  Patient has had a recent decline in their functional status and demonstrates the ability to make significant improvements in  function in a reasonable and predictable amount of time.  Equipment Recommendations  BSC/3in1;Hospital bed    Recommendations for Other Services       Precautions / Restrictions Precautions Precautions: Fall Restrictions Weight Bearing Restrictions: No      Mobility Bed Mobility Overal bed mobility: Needs Assistance             General bed mobility comments: MOD I to bridge at bed level and scoot up in bed. Attempted sup<>long sitting however pt reports pain with greater than ~20* of trunk flexion. Anticipate +1 assist for sup<>sit.    Transfers                   General transfer comment: deferred citing pain          ADL either performed or assessed with clinical judgement   ADL Overall ADL's : Needs assistance/impaired                                       General ADL Comments: IND don gown at bed level. MIN A simulated bed level bed pan use, anticipate assist for pericare bed level. MAX A for LB access at bed level, limited by 8/10 L hip pain with trunk flexion.      Pertinent Vitals/Pain Pain Assessment Pain Assessment: 0-10 Pain Score: 8  Pain Location: L hip Pain Descriptors / Indicators: Grimacing, Discomfort, Guarding Pain Intervention(s): Limited activity within patient's tolerance, Premedicated before session, Repositioned     Hand Dominance     Extremity/Trunk Assessment Upper Extremity Assessment Upper Extremity Assessment: Overall [facility]  for tasks assessed   Lower Extremity Assessment Lower Extremity Assessment: LLE deficits/detail LLE: Unable to fully assess due to pain       Communication Communication Communication: No difficulties   Cognition Arousal/Alertness: Awake/alert Behavior During Therapy: WFL for tasks assessed/performed Overall Cognitive Status: Within Functional Limits for tasks assessed                                        Home Living Family/patient expects to be discharged  to:: Unsure Living Arrangements: Alone                               Additional Comments: pt reports his mother lives in [place], [place] but he has not been living with her.      Prior Functioning/Environment Prior Level of Function : Independent/Modified Independent             Mobility Comments: states "I get done what I need to get done"          OT Problem List: Decreased range of motion;Decreased activity tolerance;Pain      OT Treatment/Interventions: Therapeutic exercise;Self-care/ADL training;Energy conservation;DME and/or AE instruction;Therapeutic activities;Balance training;Patient/family education    OT Goals(Current goals can be found in the care plan section) Acute Rehab OT Goals Patient Stated Goal: to improve pain OT Goal Formulation: With patient Time For Goal Achievement: 01/24/23 Potential to Achieve Goals: Good ADL Goals Pt Will Perform Grooming: sitting;with modified independence Pt Will Perform Lower Body Dressing: with min assist;bed level;with adaptive equipment;with caregiver independent in assisting Pt Will Transfer to Toilet: with modified independence (rolling bed level) Pt Will Perform Toileting - Clothing Manipulation and hygiene: with modified independence;bed level  OT Frequency: Min 1X/week    Co-evaluation              AM-PAC OT "6 Clicks" Daily Activity     Outcome Measure Help from another person eating meals?: None Help from another person taking care of personal grooming?: A Little Help from another person toileting, which includes using toliet, bedpan, or urinal?: A Lot Help from another person bathing (including washing, rinsing, drying)?: A Lot Help from another person to put on and taking off regular upper body clothing?: None Help from another person to put on and taking off regular lower body clothing?: A Lot 6 Click Score: 17   End of Session Nurse Communication: Mobility status  Activity Tolerance: Patient  tolerated treatment well Patient left: in bed;with call bell/phone within reach  OT Visit Diagnosis: Other abnormalities of gait and mobility (R26.89);Muscle weakness (generalized) (M62.81);Pain Pain - Right/Left: Left Pain - part of body: Hip                Time: 7829-5621 OT Time Calculation (min): 13 min Charges:  OT General Charges $OT Visit: 1 Visit OT Evaluation $OT Eval Low Complexity: 1 Low  Kathie Dike, M.S. OTR/L  01/10/23, 3:20 PM  ascom [phone]

## 2023-01-10 NOTE — Consult Note (Addendum)
PHARMACIST - PHYSICIAN COMMUNICATION  DR: Sunnie Nielsen, DO  CONCERNING: IV to Oral Route Change Policy  RECOMMENDATION: This patient is receiving thiamine, pantoprazole by the intravenous route.  Based on criteria approved by the Pharmacy and Therapeutics Committee, the intravenous medication(s) is/are being converted to the equivalent oral dose form(s).   DESCRIPTION: These criteria include: The patient is eating (either orally or via tube) and/or has been taking other orally administered medications for a least 24 hours The patient has no evidence of active gastrointestinal bleeding or impaired GI absorption (gastrectomy, short bowel, patient on TNA or NPO).  If you have questions about this conversion, please contact the Pharmacy Department  []   [phone] )  [facility] [x]   [phone] )  [facility] []   [phone] )  [facility] []   [phone] )  [facility] []   [phone] )  Ilene Qua   Celene Squibb, PharmD Clinical Pharmacist 01/10/2023 11:06 AM
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
## 2023-01-10 NOTE — TOC Progression Note (Signed)
Transition of Care [facility]) - Progression Note    Patient Details  Name: [name] MRN: [id number] Date of Birth: 11/09/1990  [facility] of [facility]) CM/SW Contact  Marlowe Sax, RN Phone Number: 01/10/2023, 9:43 AM  Clinical Narrative:    Met with the patient in the room, He report that he plans to go to his mom's house at [place] and that his mom will provide transportation, he has used crutches in the past but can't remember if he still has them or not TOC to continue to follow for any additional needs  Expected Discharge Plan: Home/Self Care Barriers to Discharge: Continued Medical Work up  Expected Discharge Plan and Services   Discharge Planning Services: CM Consult   Living arrangements for the past 2 months: Single Family Home                                       Social Determinants of Health (SDOH) Interventions SDOH Screenings   Food Insecurity: No Food Insecurity (01/09/2023)  Housing: Medium Risk (12/17/2022)  Transportation Needs: Unmet Transportation Needs (12/17/2022)  Utilities: Not At Risk (12/17/2022)  Tobacco Use: High Risk (01/08/2023)    Readmission Risk Interventions     No data to display

## 2023-01-10 NOTE — Plan of Care (Signed)
  Problem: Coping: Goal: Level of anxiety will decrease Outcome: Progressing   Problem: Elimination: Goal: Will not experience complications related to bowel motility Outcome: Progressing   Problem: Pain Managment: Goal: General experience of comfort will improve Outcome: Progressing   Problem: Safety: Goal: Ability to remain free from injury will improve Outcome: Progressing   

## 2023-01-10 NOTE — Progress Notes (Signed)
PT Cancellation Note  Patient Details Name: [name] MRN: [id number] DOB: 03/13/1991   Cancelled Treatment:    Reason Eval/Treat Not Completed: Fatigue/lethargy limiting ability to participate (Per OT, pt difficult to rouse upon attempt earlier. Pt sedated from pain meds and still not tolerant of much ranging of limb. Will hold evaluation at this time, attempt again later date/time.)  3:57 PM, 01/10/23 Rosamaria Lints, PT, DPT Physical Therapist - [facility]  [phone] (ASCOM)    [name] C 01/10/2023, 3:57 PM

## 2023-01-11 LAB — HIGH SENSITIVITY CRP: CRP, High Sensitivity: >300 mg/L — AB (ref 0.00–3.00)

## 2023-01-11 MED ORDER — HYDROMORPHONE HCL 1 MG/ML IJ SOLN
0.5000 mg | INTRAMUSCULAR | Status: DC | PRN
  Administered 2023-01-11 (×2): 0.5 mg via INTRAVENOUS
  Filled 2023-01-11 (×2): qty 0.5

## 2023-01-11 MED ORDER — HYDROMORPHONE HCL 1 MG/ML IJ SOLN
1.0000 mg | INTRAMUSCULAR | Status: DC | PRN
  Administered 2023-01-11 – 2023-01-13 (×10): 1 mg via INTRAVENOUS
  Filled 2023-01-11 (×10): qty 1

## 2023-01-11 MED ORDER — NAPROXEN 500 MG PO TABS
500.0000 mg | ORAL_TABLET | Freq: Two times a day (BID) | ORAL | Status: AC
  Administered 2023-01-11 – 2023-01-13 (×6): 500 mg via ORAL
  Filled 2023-01-11 (×6): qty 1

## 2023-01-11 MED ORDER — OXYCODONE HCL 5 MG PO TABS
15.0000 mg | ORAL_TABLET | ORAL | Status: AC
  Administered 2023-01-11 – 2023-01-12 (×6): 15 mg via ORAL
  Filled 2023-01-11 (×6): qty 3
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
## 2023-01-11 NOTE — Evaluation (Addendum)
Physical Therapy Evaluation Patient Details Name: [name] MRN: [id number] DOB: 22-May-1991 Today's Date: 01/11/2023  History of Present Illness  [name] is a 32 y.o. male with a history of drug use and ORIF left acetabulum over 5 years ago at [facility], now presenting with severe left hip degenerative changes. Radiographs today vs 2020 show possible change in screw position, currently in a possible intraarticular position.  Clinical Impression   Pt presents laying in bed, 10/10 pain in L hip. He lives with his mom in a 1 story home with 5 stairs to enter and a rail on the L but its unstable. PTA he was independent with all mobility/ADLs.   Due to Pt current pain levels, unable to tolerate bed mobility. Pt willing to participate in therapy and performed supine therex. Pt would benefit from continued therapy to maximize functional abilities.   RN notified of pain levels and administered following therapy. WB restrictions not formally addressed in chart, PT to contact MD to confirm.       Assistance Recommended at Discharge Frequent or constant Supervision/Assistance  If plan is discharge home, recommend the following:  Can travel by private vehicle  Two people to help with walking and/or transfers;Two people to help with bathing/dressing/bathroom;Direct supervision/assist for medications management;Help with stairs or ramp for entrance;Assist for transportation;Assistance with feeding;Assistance with cooking/housework;Direct supervision/assist for financial management        Equipment Recommendations    Recommendations for Other Services       Functional Status Assessment Patient has had a recent decline in their functional status and/or demonstrates limited ability to make significant improvements in function in a reasonable and predictable amount of time     Precautions / Restrictions Precautions Precautions: Fall Restrictions Weight Bearing Restrictions: No      Mobility   Bed Mobility               General bed mobility comments: Pt remained supine throughout session    Transfers                   General transfer comment: deferred due to pain levels    Ambulation/Gait                  Stairs            Wheelchair Mobility     Tilt Bed    Modified Rankin (Stroke Patients Only)       Balance                                             Pertinent Vitals/Pain Pain Assessment Pain Assessment: 0-10 Pain Score: 10-Worst pain ever Pain Location: L hip Pain Descriptors / Indicators: Constant, Discomfort, Guarding Pain Intervention(s): Limited activity within patient's tolerance, Monitored during session, Other (comment) (RN administered pain meds immediately following session)    Home Living Family/patient expects to be discharged to:: Private residence Living Arrangements: Parent   Type of Home: House Home Access: Stairs to enter Entrance Stairs-Rails: Left Entrance Stairs-Number of Steps: 5   Home Layout: One level        Prior Function Prior Level of Function : Independent/Modified Independent                     Hand Dominance        Extremity/Trunk Assessment   Upper  Extremity Assessment Upper Extremity Assessment: Overall WFL for tasks assessed    Lower Extremity Assessment Lower Extremity Assessment: RLE deficits/detail;LLE deficits/detail RLE Deficits / Details: RLE overall WFL for tasks assessed LLE: Unable to fully assess due to pain       Communication   Communication: No difficulties  Cognition Arousal/Alertness: Awake/alert Behavior During Therapy: WFL for tasks assessed/performed Overall Cognitive Status: Within Functional Limits for tasks assessed                                          General Comments General comments (skin integrity, edema, etc.): Balanced not assessed due to pain, pt remained supine throughout session     Exercises General Exercises - Lower Extremity Ankle Circles/Pumps: AROM, Both, 10 reps Quad Sets: AROM, Both, 10 reps Heel Slides: AROM, Right, 10 reps, Left, 5 reps   Assessment/Plan    PT Assessment Patient needs continued PT services  PT Problem List Decreased strength;Decreased range of motion;Decreased activity tolerance;Decreased balance;Decreased mobility;Pain;Decreased knowledge of use of DME;Decreased safety awareness       PT Treatment Interventions DME instruction;Therapeutic exercise;Gait training;Balance training;Stair training;Functional mobility training;Therapeutic activities;Patient/family education    PT Goals (Current goals can be found in the Care Plan section)  Acute Rehab PT Goals Patient Stated Goal: get better PT Goal Formulation: With patient Time For Goal Achievement: 01/25/23 Potential to Achieve Goals: Fair    Frequency Min 1X/week     Co-evaluation               AM-PAC PT "6 Clicks" Mobility  Outcome Measure Help needed turning from your back to your side while in a flat bed without using bedrails?: A Lot Help needed moving from lying on your back to sitting on the side of a flat bed without using bedrails?: Total Help needed moving to and from a bed to a chair (including a wheelchair)?: Total Help needed standing up from a chair using your arms (e.g., wheelchair or bedside chair)?: Total Help needed to walk in hospital room?: Total Help needed climbing 3-5 steps with a railing? : Total 6 Click Score: 7    End of Session   Activity Tolerance: Patient limited by pain Patient left: in bed;with call bell/phone within reach;with bed alarm set   PT Visit Diagnosis: Difficulty in walking, not elsewhere classified (R26.2);Other abnormalities of gait and mobility (R26.89);Muscle weakness (generalized) (M62.81)    Time: 3016-0109 PT Time Calculation (min) (ACUTE ONLY): 15 min   Charges:   PT Evaluation $PT Eval Low Complexity: 1 Low   PT  General Charges $$ ACUTE PT VISIT: 1 Visit        Demetri Goshert, PT, SPT 11:48 AM,01/11/23

## 2023-01-11 NOTE — Plan of Care (Signed)
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
## 2023-01-11 NOTE — Progress Notes (Signed)
Inpatient Rehab Admissions Coordinator:   Per [facility] request, pt was screened for CIR by Estill Dooms, PT, DPT.  Pt admitted for hip pain with possible change in screw position.  Unable to tolerate therapy sessions 2/2 pain levels.  Not a candidate at this time.    Estill Dooms, PT, DPT Admissions Coordinator [phone] 01/11/23  11:35 AM

## 2023-01-12 NOTE — Plan of Care (Signed)
  Problem: Activity: Goal: Risk for activity intolerance will decrease Outcome: Progressing   Problem: Coping: Goal: Level of anxiety will decrease Outcome: Progressing   Problem: Elimination: Goal: Will not experience complications related to bowel motility Outcome: Progressing   Problem: Pain Managment: Goal: General experience of comfort will improve Outcome: Progressing   Problem: Safety: Goal: Ability to remain free from injury will improve Outcome: Progressing   

## 2023-01-12 NOTE — Progress Notes (Signed)
Occupational Therapy Treatment Patient Details Name: [name] MRN: [id number] DOB: December 03, 1990 Today's Date: 01/12/2023   History of present illness [name] is a 32 y.o. male with a history of drug use and ORIF left acetabulum over 5 years ago at [facility], now presenting with severe left hip degenerative changes. Radiographs today vs 2020 show possible change in screw position, currently in a possible intraarticular position.   OT comments  [name] was seen for OT treatment on this date. Upon arrival to room pt reclined in bed, agreeable to bed level tx. Pt continues to be limited by pain with no change in functional status across last 3 dates. MOD I bridging at bed level for bed pan use, requires assist for pericare. Pt reports once pain improved he will return to independence. No further skilled acute OT needs identified. Will sign off; pt agreeable. Please re-consult if new needs arise or change in functional status.    Recommendations for follow up therapy are one component of a multi-disciplinary discharge planning process, led by the attending physician.  Recommendations may be updated based on patient status, additional functional criteria and insurance authorization.    Assistance Recommended at Discharge Intermittent Supervision/Assistance  Patient can return home with the following  A lot of help with walking and/or transfers;A lot of help with bathing/dressing/bathroom;Help with stairs or ramp for entrance   Equipment Recommendations  BSC/3in1;Hospital bed    Recommendations for Other Services      Precautions / Restrictions Precautions Precautions: Fall Restrictions Weight Bearing Restrictions: No       Mobility Bed Mobility Overal bed mobility: Modified Independent             General bed mobility comments: MOD I for limited sup<>long sittign and bridging bed level    Transfers                   General transfer comment: deferred citing pain, no  WBing orders,         ADL either performed or assessed with clinical judgement   ADL Overall ADL's : Needs assistance/impaired                                       General ADL Comments: SETUP for simulated bed level toileting      Cognition Arousal/Alertness: Awake/alert Behavior During Therapy: WFL for tasks assessed/performed Overall Cognitive Status: Within Functional Limits for tasks assessed                                                     Pertinent Vitals/ Pain       Pain Assessment Pain Assessment: 0-10 Pain Score: 10-Worst pain ever Pain Location: L hip Pain Descriptors / Indicators: Constant, Discomfort, Guarding Pain Intervention(s): Limited activity within patient's tolerance, Repositioned, Patient requesting pain meds-RN notified         Frequency  Min 1X/week        Progress Toward Goals  OT Goals(current goals can now be found in the care plan section)  Progress towards OT goals: Not progressing toward goals - comment (patient limited by pain with no change in functional status across 3 dates. Pt agrees once pain improved will return to independence. No further skilled acute  OT needs)  Acute Rehab OT Goals Patient Stated Goal: to improve pain and have surgery OT Goal Formulation: With patient Time For Goal Achievement: 01/24/23 Potential to Achieve Goals: Good ADL Goals Pt Will Perform Grooming: sitting;with modified independence Pt Will Perform Lower Body Dressing: with min assist;bed level;with adaptive equipment;with caregiver independent in assisting Pt Will Transfer to Toilet: with modified independence Pt Will Perform Toileting - Clothing Manipulation and hygiene: with modified independence;bed level  Plan Discharge plan remains appropriate;Frequency remains appropriate    Co-evaluation                 AM-PAC OT "6 Clicks" Daily Activity     Outcome Measure   Help from another person  eating meals?: None Help from another person taking care of personal grooming?: A Little Help from another person toileting, which includes using toliet, bedpan, or urinal?: A Lot Help from another person bathing (including washing, rinsing, drying)?: A Lot Help from another person to put on and taking off regular upper body clothing?: None Help from another person to put on and taking off regular lower body clothing?: A Lot 6 Click Score: 17    End of Session    OT Visit Diagnosis: Other abnormalities of gait and mobility (R26.89);Muscle weakness (generalized) (M62.81);Pain Pain - Right/Left: Left Pain - part of body: Hip   Activity Tolerance Patient limited by pain   Patient Left in bed;with call bell/phone within reach   Nurse Communication          Time: 9562-1308 OT Time Calculation (min): 10 min  Charges: OT General Charges $OT Visit: 1 Visit OT Treatments $Self Care/Home Management : 8-22 mins  Kathie Dike, M.S. OTR/L  01/12/23, 1:28 PM  ascom [phone]

## 2023-01-12 NOTE — Plan of Care (Signed)

## 2023-01-12 NOTE — Progress Notes (Signed)
PROGRESS NOTE    [name]   ZOX:[id number] DOB: 22-Mar-1991  DOA: 01/08/2023 Date of Service: 01/12/23 PCP: Default, Provider, MD     Brief Narrative / Hospital Course:  [name] is a 32 y.o. male with medical history significant for left hip pain so severe that he can't lift his left leg.  Patient does does have a history of drug abuse and alcohol abuse. He has a history of undergoing ORIF of the left acetabulum over 5 years ago at [facility].  Of note, was recently discharged on July 2 after having pneumomediastinum which was evaluated with CT chest and then an upper GI barium swallow which was negative for rupture or perforation, patient had acute kidney injury anion gap metabolic acidosis alcohol abuse altered mental status leukocytosis thrombocytosis attributed to reactive nature and polysubstance abuse.   07/22: Presented to the emergency room with complaint of left hip pain.  Admitted to hospitalist service. DVT scan was negative. 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  81/19: Seen by orthopedics, CT of the hip, patient was initially refusing CT but did end up getting it done. 07/24: Per Dr. Allena Katz, orthopedics, patient will require likely complicated left hip arthroplasty with orthopedic subspecialist possibly at [facility] or [facility].  Pain control remains a challenge. Somnolent w/ dilaudid. Scheduled oxycodone, APAP, naproxen 07/25: reduced dilaudid dose/frequency.  Increased po oxycodone dose. Goal to correct his underlying source of pain - given social issues and pain control challenge, he is not safe at this time for outpatient discharge and SNF will not take him if homeless. Ortho on call at [facility] not aware of anyone there who can perform the necessary surgery. Will reach out to [facility] and [facility] to try to transfer or otherwise arrange appropriate follow-up 07/26: long discussion w/  patient and his brother today - reviewed plan for ideally transfer but this is up to [facility], backup plan for taper off IV opiates and discharge to home/SNF to follow outpatient. At this point, he cannot ambulate   Consultants:  Orthopedic surgery  Procedures: None      ASSESSMENT & PLAN:   Principal Problem:   Acute leg pain, left Active Problems:   Rhabdomyolysis   Maculopapular rash, generalized   Hyponatremia   Hypokalemia   Heartburn   Elevated CK   Tobacco abuse   Alcohol abuse   Left hip pain  Acute left hip pain likely secondary to severe osteoarthritis in the setting of fractured hardware x-ray of the hip showed moderate to severe posttraumatic degenerative change involving the left hip and one of the screws involving the medial plate is fractured also slight interval increase in the displacement of the fragment of the left fractured screw Continue PT and OT, likely patient may need rehab Pain control -scheduled oxycodone was increased, acetaminophen, naproxen; Dilaudid for breakthrough pain, try to minimize this Correcting his underlying source of pain is goal - will work on hospital to hospital transfer - given social issues and pain control challenge, he is not safe at this time for outpatient discharge to self-care, but SNF will not take him if homeless.    Mild rhabdomyolysis -resolved D/c supplemental IV fluid   Alcohol abuse Continue thiamine 100 mg daily.  Continue CIWA and seizure precaution.    Polysubstance use disorder Urine toxicology obtain last month was positive for amphetamine and benzodiazepine Patient has been counseled on  cessation   Tobacco abuse Continue nicotine patch.    GERD Continue PPI therapy Aspiration precaution.    Hypokalemia Replace as needed Monitor BMP  Hyponatremia likely hypovolemic - improved  Monitor sodium levels closely D/c IVF   Maculopapular rash, generalized Resolving.        DVT prophylaxis:  lovenox  Pertinent IV fluids/nutrition: No continuous IV fluids Central lines / invasive devices: None  Code Status: Full code ACP documentation reviewed: 01/10/2023, none on file  Current Admission Status: Inpatient TOC needs / Dispo plan: Uncertain whether we will be able to transfer him to tertiary care center for specialized surgical intervention, may need to discharge to SNF with outpatient follow-up Barriers to discharge / significant pending items: No significant medical concern at this time but orthopedic/pain control and appropriate follow-up are a challenge at this time, see note above             Subjective / Brief ROS:  Alert, appears more comfortable today, lying supine and states he cannot comfortably move at all, but this position is ok.  Reports severe pain, stable denies CP/SOB. Denies new weakness.  Tolerating diet.  Reports no concerns w/ urination/defecation.   Family Communication: brother is at bedside     Objective Findings:  Vitals:   01/11/23 0746 01/11/23 1548 01/11/23 2330 01/12/23 0815  BP: 114/79 130/73 136/79 (!) 142/92  Pulse: 85 83 85 89  Resp: 18 16 20 18   Temp: 97.7 F (36.5 C) 98 F (36.7 C) 98.2 F (36.8 C) 98.5 F (36.9 C)  TempSrc:      SpO2: 95% 100% 98% 98%  Weight:      Height:        Intake/Output Summary (Last 24 hours) at 01/12/2023 1511 Last data filed at 01/12/2023 1400 Gross per 24 hour  Intake --  Output 3050 ml  Net -3050 ml   Filed Weights   01/08/23 1225  Weight: 99.8 kg    Examination:  Physical Exam Constitutional:      General: He is not in acute distress.    Appearance: He is not ill-appearing or toxic-appearing.  Cardiovascular:     Rate and Rhythm: Normal rate and regular rhythm.  Pulmonary:     Effort: Pulmonary effort is normal.     Breath sounds: Normal breath sounds.  Abdominal:     General: Abdomen is flat. Bowel sounds are normal.     Palpations: Abdomen is soft.  Musculoskeletal:      Right lower leg: No edema.     Left lower leg: No edema.  Skin:    General: Skin is warm and dry.  Neurological:     General: No focal deficit present.     Mental Status: He is alert and oriented to person, place, and time.  Psychiatric:        Behavior: Behavior normal.          Scheduled Medications:   enoxaparin (LOVENOX) injection  0.5 mg/kg Subcutaneous Q24H   naproxen  500 mg Oral BID WC   nicotine  21 mg Transdermal Daily   pantoprazole  40 mg Oral BID   polyethylene glycol  17 g Oral Daily   senna-docusate  2 tablet Oral Daily   sodium chloride flush  3 mL Intravenous Q12H   thiamine  100 mg Oral Daily    Continuous Infusions:   PRN Medications:  HYDROmorphone (DILAUDID) injection  Antimicrobials from admission:  Anti-infectives (From admission, onward)    None  Data Reviewed:  I have personally reviewed the following...  CBC: Recent Labs  Lab 01/08/23 1603 01/09/23 0558 01/10/23 0441  WBC 13.7* 13.9* 13.7*  NEUTROABS  --   --  11.0*  HGB 13.4 12.1* 11.9*  HCT 38.2* 34.1* 34.0*  MCV 80.4 80.0 80.4  PLT 297 283 314   Basic Metabolic Panel: Recent Labs  Lab 01/08/23 1603 01/09/23 0558 01/10/23 0441  NA 131* 130* 134*  K 3.2* 3.0* 3.4*  CL 96* 96* 105  CO2 24 24 25   GLUCOSE 111* 128* 107*  BUN 15 16 13   CREATININE 0.80 0.74 0.62  CALCIUM 9.2 8.5* 8.3*   GFR: Estimated Creatinine Clearance: 158.1 mL/min (by C-G formula based on SCr of 0.62 mg/dL). Liver Function Tests: Recent Labs  Lab 01/08/23 1603 01/09/23 0558  AST 26 16  ALT 24 19  ALKPHOS 50 45  BILITOT 0.9 0.8  PROT 7.1 6.8  ALBUMIN 3.6 3.1*   No results for input(s): "LIPASE", "AMYLASE" in the last 168 hours. No results for input(s): "AMMONIA" in the last 168 hours. Coagulation Profile: No results for input(s): "INR", "PROTIME" in the last 168 hours. Cardiac Enzymes: Recent Labs  Lab 01/08/23 1603 01/10/23 0441  CKTOTAL 141 19*   BNP (last 3  results) No results for input(s): "PROBNP" in the last 8760 hours. HbA1C: No results for input(s): "HGBA1C" in the last 72 hours. CBG: No results for input(s): "GLUCAP" in the last 168 hours. Lipid Profile: No results for input(s): "CHOL", "HDL", "LDLCALC", "TRIG", "CHOLHDL", "LDLDIRECT" in the last 72 hours. Thyroid Function Tests: No results for input(s): "TSH", "T4TOTAL", "FREET4", "T3FREE", "THYROIDAB" in the last 72 hours. Anemia Panel: No results for input(s): "VITAMINB12", "FOLATE", "FERRITIN", "TIBC", "IRON", "RETICCTPCT" in the last 72 hours. Most Recent Urinalysis On File:     Component Value Date/Time   COLORURINE STRAW (A) 12/17/2022 2153   APPEARANCEUR CLEAR (A) 12/17/2022 2153   LABSPEC 1.010 12/17/2022 2153   PHURINE 5.0 12/17/2022 2153   GLUCOSEU NEGATIVE 12/17/2022 2153   HGBUR SMALL (A) 12/17/2022 2153   BILIRUBINUR NEGATIVE 12/17/2022 2153   KETONESUR NEGATIVE 12/17/2022 2153   PROTEINUR NEGATIVE 12/17/2022 2153   UROBILINOGEN 0.2 01/30/2012 0100   NITRITE NEGATIVE 12/17/2022 2153   LEUKOCYTESUR NEGATIVE 12/17/2022 2153   Sepsis Labs: @LABRCNTIP (procalcitonin:4,lacticidven:4) Microbiology: No results found for this or any previous visit (from the past 240 hour(s)).    Radiology Studies last 3 days: CT HIP LEFT WO CONTRAST  Result Date: 01/10/2023 CLINICAL DATA:  Pain, to evaluate hardware fracture EXAM: CT OF THE LEFT HIP WITHOUT CONTRAST TECHNIQUE: Multidetector CT imaging of the left hip was performed according to the standard protocol. Multiplanar CT image reconstructions were also generated. RADIATION DOSE REDUCTION: This exam was performed according to the departmental dose-optimization program which includes automated exposure control, adjustment of the mA and/or kV according to patient size and/or use of iterative reconstruction technique. COMPARISON:  CT 12/17/2022, x-ray 06/08/2019 FINDINGS: Bones/Joint/Cartilage Postsurgical changes from a previous  left acetabular ORIF with sideplate and screw fixation constructs. The more posteriorly positioned plate is fractured along its superior margin. There is also a screw fracture at its midpoint in the posterosuperior aspect of the left acetabulum. The more anteriorly positioned plate and screw fixation construct has subsided and is now partially intra-articular in location (series 7, image 47) with 2 screws extending into the superior aspect of the left femoral head with resultant bony remodeling and prominent perihardware lucency (series 7, images 40-44). This superior aspect of  this plate construct remains partially position in the superior acetabulum and also demonstrates perihardware lucency compatible with loosening (series 7, image 51). Severe degenerative changes of the left hip joint with remodeling of the acetabulum and femoral head. Numerous lucencies along both sides of the articulation which could reflect a superimposed erosive process. Increased sclerosis within the acetabulum and proximal femur. Small complex joint effusion and/or capsular thickening. Ligaments Suboptimally assessed by CT. Muscles and Tendons No acute musculotendinous abnormality. Soft tissues Mildly prominent left inguinal and left external iliac lymph nodes, nonspecific and likely reactive. No fluid collections about the hip. IMPRESSION: 1. Postsurgical changes from a previous left acetabular ORIF with sideplate and screw fixation constructs. The more posteriorly positioned plate is fractured along its superior margin. There is also a screw fractured at its midpoint in the posterosuperior aspect of the left acetabulum. 2. The more anteriorly positioned plate and screw fixation construct has subsided and is now partially intra-articular in location with 2 screws extending into the superior aspect of the left femoral head with resultant bony remodeling and prominent perihardware lucency. This superior aspect of this plate construct  remains partially positioned in the superior acetabulum and also demonstrates perihardware lucency compatible with loosening. 3. Severe degenerative changes of the left hip joint with remodeling of the acetabulum and femoral head. Numerous lucencies along both sides of the articulation which could reflect a superimposed erosive process including septic arthritis. Correlate for clinical signs of infection. 4. Small complex joint effusion and/or capsular thickening. Electronically Signed   By: Duanne Guess D.O.   On: 01/10/2023 08:24   US Venous Img Lower Bilateral (DVT)  Result Date: 01/09/2023 CLINICAL DATA:  Left hip pain EXAM: BILATERAL LOWER EXTREMITY VENOUS DOPPLER ULTRASOUND TECHNIQUE: Gray-scale sonography with graded compression, as well as color Doppler and duplex ultrasound were performed to evaluate the lower extremity deep venous systems from the level of the common femoral vein and including the common femoral, femoral, profunda femoral, popliteal and calf veins including the posterior tibial, peroneal and gastrocnemius veins when visible. The superficial great saphenous vein was also interrogated. Spectral Doppler was utilized to evaluate flow at rest and with distal augmentation maneuvers in the common femoral, femoral and popliteal veins. COMPARISON:  None Available. FINDINGS: RIGHT LOWER EXTREMITY Common Femoral Vein: No evidence of thrombus. Normal compressibility, respiratory phasicity and response to augmentation. Saphenofemoral Junction: No evidence of thrombus. Normal compressibility and flow on color Doppler imaging. Profunda Femoral Vein: No evidence of thrombus. Normal compressibility and flow on color Doppler imaging. Femoral Vein: No evidence of thrombus. Normal compressibility, respiratory phasicity and response to augmentation. Popliteal Vein: No evidence of thrombus. Normal compressibility, respiratory phasicity and response to augmentation. Calf Veins: No evidence of thrombus.  Normal compressibility and flow on color Doppler imaging. LEFT LOWER EXTREMITY Common Femoral Vein: No evidence of thrombus. Normal compressibility, respiratory phasicity and response to augmentation. Saphenofemoral Junction: No evidence of thrombus. Normal compressibility and flow on color Doppler imaging. Profunda Femoral Vein: No evidence of thrombus. Normal compressibility and flow on color Doppler imaging. Femoral Vein: No evidence of thrombus. Normal compressibility, respiratory phasicity and response to augmentation. Popliteal Vein: No evidence of thrombus. Normal compressibility, respiratory phasicity and response to augmentation. Calf Veins: No evidence of thrombus. Normal compressibility and flow on color Doppler imaging. IMPRESSION: No evidence of deep venous thrombosis in either lower extremity. Electronically Signed   By: Feliberto Harts M.D.   On: 01/09/2023 11:49   MR BRAIN WO CONTRAST  Result Date: 01/08/2023  CLINICAL DATA:  Initial evaluation for neuro deficit, stroke suspected. EXAM: MRI HEAD WITHOUT CONTRAST TECHNIQUE: Multiplanar, multiecho pulse sequences of the brain and surrounding structures were obtained without intravenous contrast. COMPARISON:  CT from earlier the same day. FINDINGS: Brain: Cerebral volume within normal limits for age. No focal parenchymal signal abnormality. No abnormal foci of restricted diffusion to suggest acute or subacute ischemia. Gray-white matter differentiation well maintained. No encephalomalacia to suggest chronic cortical infarction or other insult. No foci of susceptibility artifact indicative of acute or chronic intracranial blood products. No mass lesion, midline shift or mass effect. Ventricles normal in size and morphology without hydrocephalus. No extra-axial fluid collection. Pituitary gland and suprasellar region within normal limits. Vascular: Major intracranial vascular flow voids are well maintained. Skull and upper cervical spine:  Craniocervical junction within normal limits. Bone marrow signal intensity mildly decreased on T1 weighted imaging, nonspecific, but most commonly related to anemia, smoking or obesity. No scalp soft tissue abnormality. Sinuses/Orbits: Globes and orbital soft tissues are within normal limits. Scattered mucosal thickening present about the ethmoidal air cells and maxillary sinuses. Superimposed right maxillary sinus retention cyst. Right-to-left nasal septal deviation with associated concha bullosa. No significant mastoid effusion. Other: None. IMPRESSION: Normal brain MRI. No acute intracranial abnormality identified. Electronically Signed   By: Rise Mu M.D.   On: 01/08/2023 22:28   MR HIP LEFT WO CONTRAST  Result Date: 01/08/2023 CLINICAL DATA:  Limping, acute hip pain. EXAM: MR OF THE LEFT HIP WITHOUT CONTRAST TECHNIQUE: Multiplanar, multisequence MR imaging was performed. No intravenous contrast was administered. COMPARISON:  Radiographs dated January 08, 2023; radiograph dated June 08, 2019 FINDINGS: Bone/hip joint: Plate and screw fixation for prior pelvic fracture. Susceptibility artifact limits evaluation. There is advanced arthritis of the left hip joint, not significantly changed from prior examination of June 08, 2019. No significant joint effusion. SI joints are normal. No SI joint widening or erosive changes. Lower lumbar spine demonstrates no focal abnormality. Capsule and ligaments Normal. Muscles and Tendons There is edema of the gluteus medius and minimus. These may be reactive changes secondary to altered mechanics and advanced arthritis. Other Findings No bursal fluid. Viscera No abnormality seen in pelvis. No lymphadenopathy. No free fluid in the pelvis. IMPRESSION: 1. Postsurgical changes with susceptibility artifact from the hardware about the left acetabulum limiting evaluation. 2. Advanced arthritis of the left hip joint, not significantly changed from prior examination  of June 08, 2019. No significant joint effusion. 3. Edema of the gluteus medius and minimus may be reactive changes secondary to altered mechanics and advanced arthritis. Electronically Signed   By: Larose Hires D.O.   On: 01/08/2023 22:10   DG Chest 1 View  Result Date: 01/08/2023 CLINICAL DATA:  Sirs EXAM: CHEST  1 VIEW COMPARISON:  12/17/2022 FINDINGS: The heart size and mediastinal contours are within normal limits. Both lungs are clear. The visualized skeletal structures are unremarkable. IMPRESSION: No active disease. Electronically Signed   By: Charlett Nose M.D.   On: 01/08/2023 20:49   CT Head Wo Contrast  Result Date: 01/08/2023 CLINICAL DATA:  Neuro deficit, acute, stroke suspected EXAM: CT HEAD WITHOUT CONTRAST TECHNIQUE: Contiguous axial images were obtained from the base of the skull through the vertex without intravenous contrast. RADIATION DOSE REDUCTION: This exam was performed according to the departmental dose-optimization program which includes automated exposure control, adjustment of the mA and/or kV according to patient size and/or use of iterative reconstruction technique. COMPARISON:  10/01/2021 FINDINGS: Brain: No evidence of  acute infarction, hemorrhage, hydrocephalus, extra-axial collection or mass lesion/mass effect. Vascular: No hyperdense vessel or unexpected calcification. Skull: Normal. Negative for fracture or focal lesion. Sinuses/Orbits: Right maxillary sinus mucosal thickening with probable retention cyst. Otherwise clear. Other: None. IMPRESSION: 1. No acute intracranial findings. 2. Right maxillary sinus disease. Electronically Signed   By: Duanne Guess D.O.   On: 01/08/2023 16:51             LOS: 4 days    Time spent: 50 min   Sunnie Nielsen, DO [facility] 01/12/2023, 3:11 PM    Dictation software may have been used to generate the above note. Typos may occur and escape review in typed/dictated notes. Please contact Dr Lyn Hollingshead  directly for clarity if needed.  Staff may message me via secure chat in Epic  but this may not receive an immediate response,  please page me for urgent matters!  If 7PM-7AM, please contact night coverage [email or web address]

## 2023-01-13 DIAGNOSIS — M79605 Pain in left leg: Secondary | ICD-10-CM | POA: Diagnosis not present

## 2023-01-13 MED ORDER — OXYCODONE HCL 5 MG PO TABS
15.0000 mg | ORAL_TABLET | ORAL | Status: DC
  Administered 2023-01-13 – 2023-01-14 (×7): 15 mg via ORAL
  Filled 2023-01-13 (×7): qty 3

## 2023-01-13 MED ORDER — OXYCODONE HCL 5 MG PO TABS
15.0000 mg | ORAL_TABLET | ORAL | Status: DC
Start: 2023-01-13 — End: 2023-01-13

## 2023-01-13 MED ORDER — HYDROMORPHONE HCL 1 MG/ML IJ SOLN
1.0000 mg | INTRAMUSCULAR | Status: DC | PRN
  Administered 2023-01-13 – 2023-01-14 (×7): 1 mg via INTRAVENOUS
  Filled 2023-01-13 (×7): qty 1

## 2023-01-13 NOTE — Plan of Care (Signed)

## 2023-01-13 NOTE — Progress Notes (Signed)
PROGRESS NOTE    [name]   ZOX:[id number] DOB: 01-27-1991  DOA: 01/08/2023 Date of Service: 01/13/23 PCP: Default, Provider, MD     Brief Narrative / Hospital Course:  [name] is a 32 y.o. male with medical history significant for left hip pain so severe that he can't lift his left leg.  Patient does does have a history of drug abuse and alcohol abuse. He has a history of undergoing ORIF of the left acetabulum over 5 years ago at [facility].  Of note, was recently discharged on July 2 after having pneumomediastinum which was evaluated with CT chest and then an upper GI barium swallow which was negative for rupture or perforation, patient had acute kidney injury anion gap metabolic acidosis alcohol abuse altered mental status leukocytosis thrombocytosis attributed to reactive nature and polysubstance abuse.   07/22: Presented to the emergency room with complaint of left hip pain.  Admitted to hospitalist service. DVT scan was negative. 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  81/19: Seen by orthopedics, CT of the hip, patient was initially refusing CT but did end up getting it done. 07/24: Per Dr. Allena Katz, orthopedics, patient will require likely complicated left hip arthroplasty with orthopedic subspecialist possibly at [facility] or [facility].  Pain control remains a challenge. Somnolent w/ dilaudid. Scheduled oxycodone, APAP, naproxen 07/25: reduced dilaudid dose/frequency.  Increased po oxycodone dose. Goal to correct his underlying source of pain - given social issues and pain control challenge, he is not safe at this time for outpatient discharge and SNF will not take him if homeless. Ortho on call at [facility] not aware of anyone there who can perform the necessary surgery. Will reach out to [facility] and [facility] to try to transfer or otherwise arrange appropriate follow-up 07/26: long discussion w/  patient and his brother today - reviewed plan for ideally transfer but this is up to [facility], backup plan for taper off IV opiates and discharge to home/SNF to follow outpatient. At this point, he cannot ambulate  07/27: [facility] unable to take him. Will reach out to [facility]   Consultants:  Orthopedic surgery  Procedures: None      ASSESSMENT & PLAN:   Principal Problem:   Acute leg pain, left Active Problems:   Rhabdomyolysis   Maculopapular rash, generalized   Hyponatremia   Hypokalemia   Heartburn   Elevated CK   Tobacco abuse   Alcohol abuse   Left hip pain  Acute left hip pain likely secondary to severe osteoarthritis in the setting of fractured hardware Severe degenerative changes of the left hip joint with remodeling of the acetabulum and femoral head x-ray of the hip showed moderate to severe posttraumatic degenerative change involving the left hip and one of the screws involving the medial plate is fractured also slight interval increase in the displacement of the fragment of the left fractured screw Continue PT and OT, likely patient may need rehab Pain control -scheduled oxycodone was increased, acetaminophen, naproxen; Dilaudid for breakthrough pain, try to minimize this Correcting his underlying source of pain is goal - will work on hospital to hospital transfer - given social issues and pain control challenge, he is not safe at this time for outpatient discharge to self-care, but SNF will not take him if homeless.  MRI negative for effusion, infection seems unlikely, trend CRP   Mild rhabdomyolysis -resolved D/c supplemental IV fluid  Alcohol abuse Continue thiamine 100 mg daily.  Continue CIWA and seizure precaution.    Polysubstance use disorder Urine toxicology obtain last month was positive for amphetamine and benzodiazepine Patient has been counseled on cessation   Tobacco abuse Continue nicotine patch.    GERD Continue PPI therapy Aspiration  precaution.    Hypokalemia Replace as needed Monitor BMP  Hyponatremia likely hypovolemic - improved  Monitor sodium levels closely D/c IVF   Maculopapular rash, generalized Resolved.        DVT prophylaxis: lovenox  Pertinent IV fluids/nutrition: No continuous IV fluids Central lines / invasive devices: None  Code Status: Full code ACP documentation reviewed: 01/10/2023, none on file  Current Admission Status: Inpatient TOC needs / Dispo plan: Uncertain whether we will be able to transfer him to tertiary care center for specialized surgical intervention, may need to discharge to SNF with outpatient follow-up Barriers to discharge / significant pending items: No significant medical concern at this time but orthopedic/pain control and appropriate follow-up are a challenge at this time, see note above             Subjective / Brief ROS:  Alert, oxycodone had been off meds, pain worse without it Reports severe pain, worse from eysterday  denies CP/SOB. Denies new weakness.  Tolerating diet.  Reports no concerns w/ urination/defecation.   Family Communication: none at this time     Objective Findings:  Vitals:   01/12/23 1640 01/12/23 2348 01/13/23 0810 01/13/23 1538  BP: 120/72 133/79 (!) 144/86 123/71  Pulse: 92 92 98 90  Resp: 16 20 18 18   Temp: 98.4 F (36.9 C) 99 F (37.2 C) 98.1 F (36.7 C) 98.4 F (36.9 C)  TempSrc:      SpO2: 96% 94% 97% 96%  Weight:      Height:        Intake/Output Summary (Last 24 hours) at 01/13/2023 1623 Last data filed at 01/13/2023 1400 Gross per 24 hour  Intake 360 ml  Output 3200 ml  Net -2840 ml   Filed Weights   01/08/23 1225  Weight: 99.8 kg    Examination:  Physical Exam Constitutional:      General: He is not in acute distress.    Appearance: He is not ill-appearing or toxic-appearing.  Cardiovascular:     Rate and Rhythm: Normal rate and regular rhythm.  Pulmonary:     Effort: Pulmonary effort is  normal.     Breath sounds: Normal breath sounds.  Abdominal:     General: Abdomen is flat. Bowel sounds are normal.     Palpations: Abdomen is soft.  Musculoskeletal:     Right lower leg: No edema.     Left lower leg: No edema.  Skin:    General: Skin is warm and dry.  Neurological:     General: No focal deficit present.     Mental Status: He is alert and oriented to person, place, and time.  Psychiatric:        Behavior: Behavior normal.          Scheduled Medications:   enoxaparin (LOVENOX) injection  0.5 mg/kg Subcutaneous Q24H   naproxen  500 mg Oral BID WC   nicotine  21 mg Transdermal Daily   oxyCODONE  15 mg Oral Q4H   pantoprazole  40 mg Oral BID   polyethylene glycol  17 g Oral Daily   senna-docusate  2 tablet Oral Daily   sodium chloride flush  3 mL Intravenous Q12H  thiamine  100 mg Oral Daily    Continuous Infusions:   PRN Medications:  HYDROmorphone (DILAUDID) injection  Antimicrobials from admission:  Anti-infectives (From admission, onward)    None           Data Reviewed:  I have personally reviewed the following...  CBC: Recent Labs  Lab 01/08/23 1603 01/09/23 0558 01/10/23 0441  WBC 13.7* 13.9* 13.7*  NEUTROABS  --   --  11.0*  HGB 13.4 12.1* 11.9*  HCT 38.2* 34.1* 34.0*  MCV 80.4 80.0 80.4  PLT 297 283 314   Basic Metabolic Panel: Recent Labs  Lab 01/08/23 1603 01/09/23 0558 01/10/23 0441  NA 131* 130* 134*  K 3.2* 3.0* 3.4*  CL 96* 96* 105  CO2 24 24 25   GLUCOSE 111* 128* 107*  BUN 15 16 13   CREATININE 0.80 0.74 0.62  CALCIUM 9.2 8.5* 8.3*   GFR: Estimated Creatinine Clearance: 158.1 mL/min (by C-G formula based on SCr of 0.62 mg/dL). Liver Function Tests: Recent Labs  Lab 01/08/23 1603 01/09/23 0558  AST 26 16  ALT 24 19  ALKPHOS 50 45  BILITOT 0.9 0.8  PROT 7.1 6.8  ALBUMIN 3.6 3.1*   No results for input(s): "LIPASE", "AMYLASE" in the last 168 hours. No results for input(s): "AMMONIA" in the  last 168 hours. Coagulation Profile: No results for input(s): "INR", "PROTIME" in the last 168 hours. Cardiac Enzymes: Recent Labs  Lab 01/08/23 1603 01/10/23 0441  CKTOTAL 141 19*   BNP (last 3 results) No results for input(s): "PROBNP" in the last 8760 hours. HbA1C: No results for input(s): "HGBA1C" in the last 72 hours. CBG: No results for input(s): "GLUCAP" in the last 168 hours. Lipid Profile: No results for input(s): "CHOL", "HDL", "LDLCALC", "TRIG", "CHOLHDL", "LDLDIRECT" in the last 72 hours. Thyroid Function Tests: No results for input(s): "TSH", "T4TOTAL", "FREET4", "T3FREE", "THYROIDAB" in the last 72 hours. Anemia Panel: No results for input(s): "VITAMINB12", "FOLATE", "FERRITIN", "TIBC", "IRON", "RETICCTPCT" in the last 72 hours. Most Recent Urinalysis On File:     Component Value Date/Time   COLORURINE STRAW (A) 12/17/2022 2153   APPEARANCEUR CLEAR (A) 12/17/2022 2153   LABSPEC 1.010 12/17/2022 2153   PHURINE 5.0 12/17/2022 2153   GLUCOSEU NEGATIVE 12/17/2022 2153   HGBUR SMALL (A) 12/17/2022 2153   BILIRUBINUR NEGATIVE 12/17/2022 2153   KETONESUR NEGATIVE 12/17/2022 2153   PROTEINUR NEGATIVE 12/17/2022 2153   UROBILINOGEN 0.2 01/30/2012 0100   NITRITE NEGATIVE 12/17/2022 2153   LEUKOCYTESUR NEGATIVE 12/17/2022 2153   Sepsis Labs: @LABRCNTIP (procalcitonin:4,lacticidven:4) Microbiology: No results found for this or any previous visit (from the past 240 hour(s)).    Radiology Studies last 3 days: CT HIP LEFT WO CONTRAST  Result Date: 01/10/2023 CLINICAL DATA:  Pain, to evaluate hardware fracture EXAM: CT OF THE LEFT HIP WITHOUT CONTRAST TECHNIQUE: Multidetector CT imaging of the left hip was performed according to the standard protocol. Multiplanar CT image reconstructions were also generated. RADIATION DOSE REDUCTION: This exam was performed according to the departmental dose-optimization program which includes automated exposure control, adjustment of the  mA and/or kV according to patient size and/or use of iterative reconstruction technique. COMPARISON:  CT 12/17/2022, x-ray 06/08/2019 FINDINGS: Bones/Joint/Cartilage Postsurgical changes from a previous left acetabular ORIF with sideplate and screw fixation constructs. The more posteriorly positioned plate is fractured along its superior margin. There is also a screw fracture at its midpoint in the posterosuperior aspect of the left acetabulum. The more anteriorly positioned plate and screw fixation construct  has subsided and is now partially intra-articular in location (series 7, image 47) with 2 screws extending into the superior aspect of the left femoral head with resultant bony remodeling and prominent perihardware lucency (series 7, images 40-44). This superior aspect of this plate construct remains partially position in the superior acetabulum and also demonstrates perihardware lucency compatible with loosening (series 7, image 51). Severe degenerative changes of the left hip joint with remodeling of the acetabulum and femoral head. Numerous lucencies along both sides of the articulation which could reflect a superimposed erosive process. Increased sclerosis within the acetabulum and proximal femur. Small complex joint effusion and/or capsular thickening. Ligaments Suboptimally assessed by CT. Muscles and Tendons No acute musculotendinous abnormality. Soft tissues Mildly prominent left inguinal and left external iliac lymph nodes, nonspecific and likely reactive. No fluid collections about the hip. IMPRESSION: 1. Postsurgical changes from a previous left acetabular ORIF with sideplate and screw fixation constructs. The more posteriorly positioned plate is fractured along its superior margin. There is also a screw fractured at its midpoint in the posterosuperior aspect of the left acetabulum. 2. The more anteriorly positioned plate and screw fixation construct has subsided and is now partially intra-articular  in location with 2 screws extending into the superior aspect of the left femoral head with resultant bony remodeling and prominent perihardware lucency. This superior aspect of this plate construct remains partially positioned in the superior acetabulum and also demonstrates perihardware lucency compatible with loosening. 3. Severe degenerative changes of the left hip joint with remodeling of the acetabulum and femoral head. Numerous lucencies along both sides of the articulation which could reflect a superimposed erosive process including septic arthritis. Correlate for clinical signs of infection. 4. Small complex joint effusion and/or capsular thickening. Electronically Signed   By: Duanne Guess D.O.   On: 01/10/2023 08:24             LOS: 5 days    Time spent: 50 min   Sunnie Nielsen, DO [facility] 01/13/2023, 4:23 PM    Dictation software may have been used to generate the above note. Typos may occur and escape review in typed/dictated notes. Please contact Dr Lyn Hollingshead directly for clarity if needed.  Staff may message me via secure chat in Epic  but this may not receive an immediate response,  please page me for urgent matters!  If 7PM-7AM, please contact night coverage [email or web address]

## 2023-01-13 NOTE — Progress Notes (Signed)
Patient Nicotine patch fell off during patient care. Nicotine patch wasted another one pulled. Nicotine patch applied to Left upper arm, Tegaderm applied to secure.

## 2023-01-14 DIAGNOSIS — M79605 Pain in left leg: Secondary | ICD-10-CM | POA: Diagnosis not present

## 2023-01-14 LAB — C-REACTIVE PROTEIN: CRP: 19.7 mg/dL — ABNORMAL HIGH (ref <1.0)

## 2023-01-14 LAB — CK: Total CK: 11 U/L — ABNORMAL LOW (ref 49–397)

## 2023-01-14 MED ORDER — OXYCODONE HCL 5 MG PO TABS
15.0000 mg | ORAL_TABLET | ORAL | Status: DC | PRN
  Administered 2023-01-14 – 2023-01-24 (×54): 15 mg via ORAL
  Filled 2023-01-14 (×55): qty 3

## 2023-01-14 MED ORDER — FENTANYL 25 MCG/HR TD PT72
1.0000 | MEDICATED_PATCH | TRANSDERMAL | Status: DC
  Administered 2023-01-14 – 2023-01-23 (×4): 1 via TRANSDERMAL
  Filled 2023-01-14 (×4): qty 1

## 2023-01-14 NOTE — Progress Notes (Signed)
PT Cancellation Note  Patient Details Name: [name] MRN: [id number] DOB: 1990/12/01   Cancelled Treatment:    Reason Eval/Treat Not Completed: Pain limiting ability to participate  Offered and encouraged session. Pt stated he is in too much pain to participate and stated he is due for pain meds and calls desk for them during conversation.  Does not feel he can participate today.  Will offer and encourage again tomorrow.   Danielle Dess 01/14/2023, 10:42 AM

## 2023-01-14 NOTE — Progress Notes (Signed)
PROGRESS NOTE    [name]   WFU:[id number] DOB: 05/02/1991  DOA: 01/08/2023 Date of Service: 01/14/23 PCP: Default, Provider, MD     Brief Narrative / Hospital Course:  [name] is a 32 y.o. male with medical history significant for left hip pain so severe that he can't lift his left leg.  Patient does does have a history of drug abuse and alcohol abuse. He has a history of undergoing ORIF of the left acetabulum over 5 years ago at [facility].  Of note, was recently discharged on July 2 after having pneumomediastinum which was evaluated with CT chest and then an upper GI barium swallow which was negative for rupture or perforation, patient had acute kidney injury anion gap metabolic acidosis alcohol abuse altered mental status leukocytosis thrombocytosis attributed to reactive nature and polysubstance abuse.   07/22: Presented to the emergency room with complaint of left hip pain.  Admitted to hospitalist service. DVT scan was negative. 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  20/25: Seen by orthopedics, CT of the hip, patient was initially refusing CT but did end up getting it done. 07/24: Per Dr. Allena Katz, orthopedics, patient will require likely complicated left hip arthroplasty with orthopedic subspecialist possibly at [facility] or [facility].  Pain control remains a challenge. Somnolent w/ dilaudid. Scheduled oxycodone, APAP, naproxen 07/25: reduced dilaudid dose/frequency.  Increased po oxycodone dose. Goal to correct his underlying source of pain - given social issues and pain control challenge, he is not safe at this time for outpatient discharge and SNF will not take him if homeless. Ortho on call at [facility] not aware of anyone there who can perform the necessary surgery. Will reach out to [facility] and [facility] to try to transfer or otherwise arrange appropriate follow-up 07/26: long discussion w/  patient and his brother today - reviewed plan for ideally transfer but this is up to [facility], backup plan for taper off IV opiates and discharge to home/SNF to follow outpatient. At this point, he cannot ambulate  07/27: [facility] unable to take him.  07/28: Will reach out to [facility] and [facility] Monday to see about arranging outpatient follow-up. Working on tapering off IV pain control, have consulted pharmacy to confirm MME and safe transition to fentanyl patches. Pt is aware this is temporary measure until he can get hip repaired, at which point anticipate tapering off / following up with pain management and possibly w/ addiction recovery care.   Consultants:  Orthopedic surgery  Procedures: None      ASSESSMENT & PLAN:   Principal Problem:   Acute leg pain, left Active Problems:   Rhabdomyolysis   Maculopapular rash, generalized   Hyponatremia   Hypokalemia   Heartburn   Elevated CK   Tobacco abuse   Alcohol abuse   Left hip pain  Acute left hip pain likely secondary to severe osteoarthritis in the setting of fractured hardware Severe degenerative changes of the left hip joint with remodeling of the acetabulum and femoral head x-ray of the hip showed moderate to severe posttraumatic degenerative change involving the left hip and one of the screws involving the medial plate is fractured also slight interval increase in the displacement of the fragment of the left fractured screw Continue PT and OT, likely patient may need rehab but SNF will not take him if homeless  Correcting his underlying source of pain is goal -  will work on hospital to hospital transfer - given social issues and pain control challenge, he is not safe at this time for outpatient discharge to self-care, but SNF will not take him if homeless.  MRI negative for effusion, infection seems unlikely, trend CRP Pain control -scheduled oxycodone was increased, acetaminophen, naproxen; Dilaudid for breakthrough pain, try to  minimize this Working on tapering off IV pain control, have consulted pharmacy to confirm MME and safe transition to fentanyl patches --> starting fentanyl, making oxy prn, d/c dilaudid. Pt is aware this is limited-time measure until he can get hip repaired, at which point anticipate tapering off / following up with pain management and possibly w/ addiction recovery care.  Anticipate LLOS d/t social issues and inability to provide needed treatment here for hip / inability thus far to secure transfer to tertiary care    Mild rhabdomyolysis -resolved D/c supplemental IV fluid   Alcohol abuse Continue thiamine 100 mg daily.  Continue CIWA and seizure precaution.    Polysubstance use disorder Urine toxicology obtain last month was positive for amphetamine and benzodiazepine Patient has been counseled on cessation   Tobacco abuse Continue nicotine patch.    GERD Continue PPI therapy Aspiration precaution.    Hypokalemia Replace as needed Monitor BMP  Hyponatremia likely hypovolemic - improved  Monitor sodium levels closely D/c IVF   Maculopapular rash, generalized Resolved.        DVT prophylaxis: lovenox  Pertinent IV fluids/nutrition: No continuous IV fluids Central lines / invasive devices: None  Code Status: Full code ACP documentation reviewed: 01/10/2023, none on file  Current Admission Status: Inpatient TOC needs / Dispo plan: Uncertain whether we will be able to transfer him to tertiary care center for specialized surgical intervention, may need to discharge to SNF with outpatient follow-up Barriers to discharge / significant pending items: No significant medical concern at this time but orthopedic/pain control and appropriate follow-up are a challenge at this time, see note above             Subjective / Brief ROS:  Reports severe pain, only slightly improved from eysterday  denies CP/SOB. Denies new weakness.  Tolerating diet.  Reports no concerns w/  urination/defecation.   Family Communication: none at this time     Objective Findings:  Vitals:   01/13/23 1538 01/14/23 0043 01/14/23 0834 01/14/23 0908  BP: 123/71 123/76 (!) 121/90 125/81  Pulse: 90 89 87 96  Resp: 18 20  18   Temp: 98.4 F (36.9 C) 98.5 F (36.9 C)  98.1 F (36.7 C)  TempSrc:    Oral  SpO2: 96% 97% 96% 97%  Weight:      Height:        Intake/Output Summary (Last 24 hours) at 01/14/2023 1444 Last data filed at 01/14/2023 1234 Gross per 24 hour  Intake 240 ml  Output 1900 ml  Net -1660 ml   Filed Weights   01/08/23 1225  Weight: 99.8 kg    Examination:  Physical Exam Constitutional:      General: He is not in acute distress.    Appearance: He is not ill-appearing or toxic-appearing.  Cardiovascular:     Rate and Rhythm: Normal rate and regular rhythm.  Pulmonary:     Effort: Pulmonary effort is normal.     Breath sounds: Normal breath sounds.  Abdominal:     General: Abdomen is flat. Bowel sounds are normal.     Palpations: Abdomen is soft.  Musculoskeletal:  Right lower leg: No edema.     Left lower leg: No edema.  Skin:    General: Skin is warm and dry.  Neurological:     General: No focal deficit present.     Mental Status: He is alert and oriented to person, place, and time.  Psychiatric:        Behavior: Behavior normal.          Scheduled Medications:   enoxaparin (LOVENOX) injection  0.5 mg/kg Subcutaneous Q24H   fentaNYL  1 patch Transdermal Q72H   nicotine  21 mg Transdermal Daily   pantoprazole  40 mg Oral BID   polyethylene glycol  17 g Oral Daily   senna-docusate  2 tablet Oral Daily   sodium chloride flush  3 mL Intravenous Q12H   thiamine  100 mg Oral Daily    Continuous Infusions:   PRN Medications:  oxyCODONE  Antimicrobials from admission:  Anti-infectives (From admission, onward)    None           Data Reviewed:  I have personally reviewed the following...  CBC: Recent Labs  Lab  01/08/23 1603 01/09/23 0558 01/10/23 0441  WBC 13.7* 13.9* 13.7*  NEUTROABS  --   --  11.0*  HGB 13.4 12.1* 11.9*  HCT 38.2* 34.1* 34.0*  MCV 80.4 80.0 80.4  PLT 297 283 314   Basic Metabolic Panel: Recent Labs  Lab 01/08/23 1603 01/09/23 0558 01/10/23 0441  NA 131* 130* 134*  K 3.2* 3.0* 3.4*  CL 96* 96* 105  CO2 24 24 25   GLUCOSE 111* 128* 107*  BUN 15 16 13   CREATININE 0.80 0.74 0.62  CALCIUM 9.2 8.5* 8.3*   GFR: Estimated Creatinine Clearance: 158.1 mL/min (by C-G formula based on SCr of 0.62 mg/dL). Liver Function Tests: Recent Labs  Lab 01/08/23 1603 01/09/23 0558  AST 26 16  ALT 24 19  ALKPHOS 50 45  BILITOT 0.9 0.8  PROT 7.1 6.8  ALBUMIN 3.6 3.1*   No results for input(s): "LIPASE", "AMYLASE" in the last 168 hours. No results for input(s): "AMMONIA" in the last 168 hours. Coagulation Profile: No results for input(s): "INR", "PROTIME" in the last 168 hours. Cardiac Enzymes: Recent Labs  Lab 01/08/23 1603 01/10/23 0441 01/14/23 0429  CKTOTAL 141 19* 11*   BNP (last 3 results) No results for input(s): "PROBNP" in the last 8760 hours. HbA1C: No results for input(s): "HGBA1C" in the last 72 hours. CBG: No results for input(s): "GLUCAP" in the last 168 hours. Lipid Profile: No results for input(s): "CHOL", "HDL", "LDLCALC", "TRIG", "CHOLHDL", "LDLDIRECT" in the last 72 hours. Thyroid Function Tests: No results for input(s): "TSH", "T4TOTAL", "FREET4", "T3FREE", "THYROIDAB" in the last 72 hours. Anemia Panel: No results for input(s): "VITAMINB12", "FOLATE", "FERRITIN", "TIBC", "IRON", "RETICCTPCT" in the last 72 hours. Most Recent Urinalysis On File:     Component Value Date/Time   COLORURINE STRAW (A) 12/17/2022 2153   APPEARANCEUR CLEAR (A) 12/17/2022 2153   LABSPEC 1.010 12/17/2022 2153   PHURINE 5.0 12/17/2022 2153   GLUCOSEU NEGATIVE 12/17/2022 2153   HGBUR SMALL (A) 12/17/2022 2153   BILIRUBINUR NEGATIVE 12/17/2022 2153   KETONESUR  NEGATIVE 12/17/2022 2153   PROTEINUR NEGATIVE 12/17/2022 2153   UROBILINOGEN 0.2 01/30/2012 0100   NITRITE NEGATIVE 12/17/2022 2153   LEUKOCYTESUR NEGATIVE 12/17/2022 2153   Sepsis Labs: @LABRCNTIP (procalcitonin:4,lacticidven:4) Microbiology: No results found for this or any previous visit (from the past 240 hour(s)).    Radiology Studies last 3 days: No  results found.           LOS: 6 days    Time spent: 50 min   Sunnie Nielsen, DO [facility] 01/14/2023, 2:44 PM    Dictation software may have been used to generate the above note. Typos may occur and escape review in typed/dictated notes. Please contact Dr Lyn Hollingshead directly for clarity if needed.  Staff may message me via secure chat in Epic  but this may not receive an immediate response,  please page me for urgent matters!  If 7PM-7AM, please contact night coverage [email or web address]

## 2023-01-14 NOTE — Progress Notes (Signed)
Left PIV was leaking. PIV removed. Patient states he does not want a new IV placed at this time.

## 2023-01-14 NOTE — Plan of Care (Signed)

## 2023-01-14 NOTE — Plan of Care (Signed)
  Problem: Education: Goal: Knowledge of General Education information will improve Description: Including pain rating scale, medication(s)/side effects and non-pharmacologic comfort measures Outcome: Progressing   Problem: Health Behavior/Discharge Planning: Goal: Ability to manage health-related needs will improve Outcome: Progressing   Problem: Clinical Measurements: Goal: Ability to maintain clinical measurements within normal limits will improve Outcome: Progressing Goal: Will remain free from infection Outcome: Progressing Goal: Diagnostic test results will improve Outcome: Progressing Goal: Respiratory complications will improve Outcome: Progressing Goal: Cardiovascular complication will be avoided Outcome: Progressing   Problem: Safety: Goal: Ability to remain free from injury will improve Outcome: Progressing   Problem: Pain Managment: Goal: General experience of comfort will improve Outcome: Progressing   Problem: Elimination: Goal: Will not experience complications related to bowel motility Outcome: Progressing Goal: Will not experience complications related to urinary retention Outcome: Progressing

## 2023-01-15 DIAGNOSIS — M79605 Pain in left leg: Secondary | ICD-10-CM | POA: Diagnosis not present

## 2023-01-15 MED ORDER — KETOROLAC TROMETHAMINE 60 MG/2ML IM SOLN
60.0000 mg | Freq: Once | INTRAMUSCULAR | Status: AC
  Administered 2023-01-15: 60 mg via INTRAMUSCULAR
  Filled 2023-01-15: qty 2

## 2023-01-15 MED ORDER — NAPROXEN 500 MG PO TABS
500.0000 mg | ORAL_TABLET | Freq: Two times a day (BID) | ORAL | Status: DC
  Administered 2023-01-15 – 2023-01-24 (×18): 500 mg via ORAL
  Filled 2023-01-15 (×20): qty 1

## 2023-01-15 MED ORDER — HYDROMORPHONE HCL 1 MG/ML IJ SOLN: 0.5000 mg | Freq: Two times a day (BID) | INTRAMUSCULAR | Status: DC | PRN

## 2023-01-15 MED ORDER — NAPROXEN 500 MG PO TABS: 500.0000 mg | ORAL_TABLET | Freq: Two times a day (BID) | ORAL | Status: DC

## 2023-01-15 NOTE — Progress Notes (Signed)
PROGRESS NOTE    [name]   VHQ:[id number] DOB: 02/20/1991  DOA: 01/08/2023 Date of Service: 01/15/23 PCP: Default, Provider, MD     Brief Narrative / Hospital Course:  [name] is a 32 y.o. male with medical history significant for left hip pain so severe that he can't lift his left leg.  Patient does does have a history of drug abuse and alcohol abuse. He has a history of undergoing ORIF of the left acetabulum over 5 years ago at [facility].  Of note, was recently discharged on July 2 after having pneumomediastinum which was evaluated with CT chest and then an upper GI barium swallow which was negative for rupture or perforation, patient had acute kidney injury anion gap metabolic acidosis alcohol abuse altered mental status leukocytosis thrombocytosis attributed to reactive nature and polysubstance abuse.   07/22: Presented to the emergency room with complaint of left hip pain.  Admitted to hospitalist service. DVT scan was negative. 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  41/32: Seen by orthopedics, CT of the hip, patient was initially refusing CT but did end up getting it done. 07/24: Per Dr. Allena Katz, orthopedics, patient will require likely complicated left hip arthroplasty with orthopedic subspecialist possibly at [facility] or [facility].  Pain control remains a challenge. Somnolent w/ dilaudid. Scheduled oxycodone, APAP, naproxen 07/25: reduced dilaudid dose/frequency.  Increased po oxycodone dose. Goal to correct his underlying source of pain - given social issues and pain control challenge, he is not safe at this time for outpatient discharge and SNF will not take him if homeless. Ortho on call at [facility] not aware of anyone there who can perform the necessary surgery. Will reach out to [facility] and [facility] to try to transfer or otherwise arrange appropriate follow-up 07/26: long discussion w/  patient and his brother today - reviewed plan for ideally transfer but this is up to [facility], backup plan for taper off IV opiates and discharge to home/SNF to follow outpatient. At this point, he cannot ambulate  07/27: [facility] unable to take him.  07/28: Will reach out to [facility] and [facility] Monday to see about arranging outpatient follow-up. Working on tapering off IV pain control, have consulted pharmacy to confirm MME and safe transition to fentanyl patches. Pt is aware this is temporary measure until he can get hip repaired, at which point anticipate tapering off / following up with pain management and possibly w/ addiction recovery care.  07/29: called [facility], waiting to hear back. Currently on fentanyl patch 25 mcg, still using oxydocone 15 mg po q4h prn, OFF dilaudid, added back Naproxen today   Consultants:  Orthopedic surgery  Procedures: None    ASSESSMENT & PLAN:   Principal Problem:   Acute leg pain, left Active Problems:   Rhabdomyolysis   Maculopapular rash, generalized   Hyponatremia   Hypokalemia   Heartburn   Elevated CK   Tobacco abuse   Alcohol abuse   Left hip pain  Acute left hip pain likely secondary to severe osteoarthritis in the setting of fractured hardware Severe degenerative changes of the left hip joint with remodeling of the acetabulum and femoral head x-ray of the hip showed moderate to severe posttraumatic degenerative change involving the left hip and one of the screws involving the medial plate is fractured also slight interval increase in the displacement of the fragment of the left fractured screw Continue  PT and OT, likely patient may need rehab but SNF will not take him if homeless  Correcting his underlying source of pain is goal - will work on hospital to hospital transfer - given social issues and pain control challenge, he is not safe at this time for outpatient discharge to self-care, but SNF will not take him if homeless.  MRI negative for effusion,  infection seems unlikely, trend CRP Pain control --> started fentanyl TD, making oxy prn, d/c dilaudid. Pt is aware this is limited-time measure until he can get hip repaired, at which point anticipate tapering off / following up with pain management and possibly w/ addiction recovery care.  Anticipate LLOS d/t social issues and inability to provide needed treatment here for hip / inability thus far to secure transfer to tertiary care    Mild rhabdomyolysis -resolved D/c supplemental IV fluid   Alcohol abuse Continue thiamine 100 mg daily.  Continue CIWA and seizure precaution.    Polysubstance use disorder Urine toxicology obtain last month was positive for amphetamine and benzodiazepine Patient has been counseled on cessation   Tobacco abuse Continue nicotine patch.    GERD Continue PPI therapy Aspiration precaution.    Hypokalemia Replace as needed Monitor BMP  Hyponatremia likely hypovolemic - improved  Monitor sodium levels closely D/c IVF   Maculopapular rash, generalized Resolved.        DVT prophylaxis: lovenox  Pertinent IV fluids/nutrition: No continuous IV fluids Central lines / invasive devices: None  Code Status: Full code ACP documentation reviewed: 01/10/2023, none on file  Current Admission Status: Inpatient TOC needs / Dispo plan: Uncertain whether we will be able to transfer him to tertiary care center for specialized surgical intervention, may need to discharge to SNF with outpatient follow-up Barriers to discharge / significant pending items: No significant medical concern at this time but orthopedic/pain control and appropriate follow-up are a challenge at this time, see note above             Subjective / Brief ROS:  Reports severe pain no change  denies CP/SOB. Denies new weakness.  Tolerating diet.  Reports no concerns w/ urination/defecation.   Family Communication: none at this time     Objective Findings:  Vitals:    01/14/23 1622 01/14/23 1633 01/15/23 0030 01/15/23 0757  BP: 130/80 133/80 130/78 (!) 129/90  Pulse: 91 93 92 89  Resp:  16 16 16   Temp:  99.1 F (37.3 C) 97.9 F (36.6 C) 98.3 F (36.8 C)  TempSrc:    Oral  SpO2:  95% 96% 96%  Weight:      Height:        Intake/Output Summary (Last 24 hours) at 01/15/2023 1445 Last data filed at 01/15/2023 1422 Gross per 24 hour  Intake 480 ml  Output 2650 ml  Net -2170 ml   Filed Weights   01/08/23 1225  Weight: 99.8 kg    Examination:  Physical Exam Constitutional:      General: He is not in acute distress.    Appearance: He is not ill-appearing or toxic-appearing.  Cardiovascular:     Rate and Rhythm: Normal rate and regular rhythm.  Pulmonary:     Effort: Pulmonary effort is normal.     Breath sounds: Normal breath sounds.  Abdominal:     General: Abdomen is flat. Bowel sounds are normal.     Palpations: Abdomen is soft.  Musculoskeletal:     Right lower leg: No edema.  Left lower leg: No edema.  Skin:    General: Skin is warm and dry.  Neurological:     General: No focal deficit present.     Mental Status: He is alert and oriented to person, place, and time.  Psychiatric:        Behavior: Behavior normal.          Scheduled Medications:   enoxaparin (LOVENOX) injection  0.5 mg/kg Subcutaneous Q24H   fentaNYL  1 patch Transdermal Q72H   ketorolac  60 mg Intramuscular Once   naproxen  500 mg Oral BID WC   nicotine  21 mg Transdermal Daily   pantoprazole  40 mg Oral BID   polyethylene glycol  17 g Oral Daily   senna-docusate  2 tablet Oral Daily   sodium chloride flush  3 mL Intravenous Q12H   thiamine  100 mg Oral Daily    Continuous Infusions:   PRN Medications:  oxyCODONE  Antimicrobials from admission:  Anti-infectives (From admission, onward)    None           Data Reviewed:  I have personally reviewed the following...  CBC: Recent Labs  Lab 01/08/23 1603 01/09/23 0558  01/10/23 0441 01/15/23 0457  WBC 13.7* 13.9* 13.7* 11.2*  NEUTROABS  --   --  11.0*  --   HGB 13.4 12.1* 11.9* 12.2*  HCT 38.2* 34.1* 34.0* 36.1*  MCV 80.4 80.0 80.4 81.9  PLT 297 283 314 597*   Basic Metabolic Panel: Recent Labs  Lab 01/08/23 1603 01/09/23 0558 01/10/23 0441 01/15/23 0457  NA 131* 130* 134* 133*  K 3.2* 3.0* 3.4* 4.1  CL 96* 96* 105 97*  CO2 24 24 25 25   GLUCOSE 111* 128* 107* 106*  BUN 15 16 13  27*  CREATININE 0.80 0.74 0.62 0.79  CALCIUM 9.2 8.5* 8.3* 9.2   GFR: Estimated Creatinine Clearance: 158.1 mL/min (by C-G formula based on SCr of 0.79 mg/dL). Liver Function Tests: Recent Labs  Lab 01/08/23 1603 01/09/23 0558  AST 26 16  ALT 24 19  ALKPHOS 50 45  BILITOT 0.9 0.8  PROT 7.1 6.8  ALBUMIN 3.6 3.1*   No results for input(s): "LIPASE", "AMYLASE" in the last 168 hours. No results for input(s): "AMMONIA" in the last 168 hours. Coagulation Profile: No results for input(s): "INR", "PROTIME" in the last 168 hours. Cardiac Enzymes: Recent Labs  Lab 01/08/23 1603 01/10/23 0441 01/14/23 0429  CKTOTAL 141 19* 11*   BNP (last 3 results) No results for input(s): "PROBNP" in the last 8760 hours. HbA1C: No results for input(s): "HGBA1C" in the last 72 hours. CBG: No results for input(s): "GLUCAP" in the last 168 hours. Lipid Profile: No results for input(s): "CHOL", "HDL", "LDLCALC", "TRIG", "CHOLHDL", "LDLDIRECT" in the last 72 hours. Thyroid Function Tests: No results for input(s): "TSH", "T4TOTAL", "FREET4", "T3FREE", "THYROIDAB" in the last 72 hours. Anemia Panel: No results for input(s): "VITAMINB12", "FOLATE", "FERRITIN", "TIBC", "IRON", "RETICCTPCT" in the last 72 hours. Most Recent Urinalysis On File:     Component Value Date/Time   COLORURINE STRAW (A) 12/17/2022 2153   APPEARANCEUR CLEAR (A) 12/17/2022 2153   LABSPEC 1.010 12/17/2022 2153   PHURINE 5.0 12/17/2022 2153   GLUCOSEU NEGATIVE 12/17/2022 2153   HGBUR SMALL (A)  12/17/2022 2153   BILIRUBINUR NEGATIVE 12/17/2022 2153   KETONESUR NEGATIVE 12/17/2022 2153   PROTEINUR NEGATIVE 12/17/2022 2153   UROBILINOGEN 0.2 01/30/2012 0100   NITRITE NEGATIVE 12/17/2022 2153   LEUKOCYTESUR NEGATIVE 12/17/2022 2153  Sepsis Labs: @LABRCNTIP (procalcitonin:4,lacticidven:4) Microbiology: No results found for this or any previous visit (from the past 240 hour(s)).    Radiology Studies last 3 days: No results found.           LOS: 7 days    Time spent: 50 min   Sunnie Nielsen, DO [facility] 01/15/2023, 2:45 PM    Dictation software may have been used to generate the above note. Typos may occur and escape review in typed/dictated notes. Please contact Dr Lyn Hollingshead directly for clarity if needed.  Staff may message me via secure chat in Epic  but this may not receive an immediate response,  please page me for urgent matters!  If 7PM-7AM, please contact night coverage [email or web address]

## 2023-01-15 NOTE — Progress Notes (Signed)
Pt refused mobility

## 2023-01-15 NOTE — TOC Progression Note (Signed)
Transition of Care [facility]) - Progression Note    Patient Details  Name: [name] MRN: [id number] Date of Birth: 05-16-1991  [facility] CM/SW Contact  Marlowe Sax, RN Phone Number: 01/15/2023, 10:25 AM  Clinical Narrative:     TOC continues to follow the patient and assist with DC planning  Expected Discharge Plan: Home/Self Care Barriers to Discharge: Continued Medical Work up  Expected Discharge Plan and Services   Discharge Planning Services: CM Consult   Living arrangements for the past 2 months: Single Family Home                                       Social Determinants of Health (SDOH) Interventions SDOH Screenings   Food Insecurity: No Food Insecurity (01/09/2023)  Housing: Medium Risk (12/17/2022)  Transportation Needs: Unmet Transportation Needs (12/17/2022)  Utilities: Not At Risk (12/17/2022)  Tobacco Use: High Risk (01/08/2023)    Readmission Risk Interventions     No data to display

## 2023-01-15 NOTE — Plan of Care (Signed)

## 2023-01-15 NOTE — Plan of Care (Signed)

## 2023-01-15 NOTE — Progress Notes (Signed)
PT Cancellation Note  Patient Details Name: [name] MRN: [id number] DOB: 01/22/1991   Cancelled Treatment:    Reason Eval/Treat Not Completed: Pain limiting ability to participate  Pt offered and encouraged session.  Pt continues to decline due to pain.  Explained risks/benefits or remaining supine in bed but her stated "I'll be all right".  Asked for lights off and door closed as I exited room.   Danielle Dess 01/15/2023, 9:55 AM

## 2023-01-16 DIAGNOSIS — M79605 Pain in left leg: Secondary | ICD-10-CM | POA: Diagnosis not present

## 2023-01-16 LAB — BASIC METABOLIC PANEL
Anion gap: 10 (ref 5–15)
BUN: 34 mg/dL — ABNORMAL HIGH (ref 6–20)
CO2: 25 mmol/L (ref 22–32)
Calcium: 9.2 mg/dL (ref 8.9–10.3)
Chloride: 100 mmol/L (ref 98–111)
Creatinine, Ser: 0.76 mg/dL (ref 0.61–1.24)
GFR, Estimated: >60 mL/min (ref >60)
Glucose, Bld: 114 mg/dL — ABNORMAL HIGH (ref 70–99)
Potassium: 4.2 mmol/L (ref 3.5–5.1)
Sodium: 135 mmol/L (ref 135–145)

## 2023-01-16 LAB — C-REACTIVE PROTEIN: CRP: 6.3 mg/dL — ABNORMAL HIGH (ref <1.0)

## 2023-01-16 LAB — CBC
HCT: 35.7 % — ABNORMAL LOW (ref 39.0–52.0)
Hemoglobin: 12.2 g/dL — ABNORMAL LOW (ref 13.0–17.0)
MCH: 27.8 pg (ref 26.0–34.0)
MCHC: 34.2 g/dL (ref 30.0–36.0)
MCV: 81.3 fL (ref 80.0–100.0)
Platelets: 635 10*3/uL — ABNORMAL HIGH (ref 150–400)
RBC: 4.39 MIL/uL (ref 4.22–5.81)
RDW: 13.2 % (ref 11.5–15.5)
WBC: 10.1 10*3/uL (ref 4.0–10.5)
nRBC: 0 % (ref 0.0–0.2)

## 2023-01-16 LAB — SEDIMENTATION RATE: Sed Rate: 92 mm/hr — ABNORMAL HIGH (ref 0–15)

## 2023-01-16 NOTE — Progress Notes (Addendum)
PROGRESS NOTE    [name]   GMW:[id number] DOB: 06-22-1990  DOA: 01/08/2023 Date of Service: 01/16/23 PCP: Default, Provider, MD     Brief Narrative / Hospital Course:  [name] is a 32 y.o. male with medical history significant for left hip pain so severe that he can't lift his left leg.  Patient does does have a history of drug abuse and alcohol abuse. He has a history of undergoing ORIF of the left acetabulum over 5 years ago at [facility].  Of note, was recently discharged on July 2 after having pneumomediastinum which was evaluated with CT chest and then an upper GI barium swallow which was negative for rupture or perforation, patient had acute kidney injury anion gap metabolic acidosis alcohol abuse altered mental status leukocytosis thrombocytosis attributed to reactive nature and polysubstance abuse.   07/22: Presented to the emergency room with complaint of left hip pain.  Admitted to hospitalist service. DVT negative. Severe degeneration of the left hip joint with prior hardware from ORIF acetabulum in place with a broken screw and plate.  44/03: Seen by orthopedics, CT of the hip recommended, patient was initially refusing CT but did end up getting it done. 07/24: Per Dr. Allena Katz, orthopedics, patient will require likely complicated left hip arthroplasty with orthopedic subspecialist possibly at [facility] or [facility].  Pain control remains a challenge. 07/25: Goal to correct his underlying source of pain - given social issues and pain control challenge, he is not safe at this time for outpatient discharge and SNF will not take him if homeless. Unable to secure transfer to [facility] or [facility] today.  07/26: long discussion w/ patient and his brother today - reviewed plan for ideally transfer but this is up to [facility], backup plan for taper off IV opiates and discharge to home/SNF to follow outpatient. At this point, he cannot ambulate. Pt voices understanding and is grateful for our efforts here.  07/27-07/30:  [facility] unable to take him. Tapered off IV pain meds onto fentanyl patch. Pt is aware this is overall a temporary therapy until he can get hip repaired, at which point anticipate tapering off opiates / following up with pain management and possibly w/ addiction recovery care.   Consultants:  Orthopedic surgery  Procedures: None    ASSESSMENT & PLAN:   Principal Problem:   Acute leg pain, left Active Problems:   Rhabdomyolysis   Maculopapular rash, generalized   Hyponatremia   Hypokalemia   Heartburn   Elevated CK   Tobacco abuse   Alcohol abuse   Left hip pain  Acute left hip pain likely secondary to severe osteoarthritis in the setting of fractured hardware Severe degenerative changes of the left hip joint with remodeling of the acetabulum and femoral head x-ray of the hip showed moderate to severe posttraumatic degenerative change involving the left hip and one of the screws involving the medial plate is fractured also slight interval increase in the displacement of the fragment of the left fractured screw Continue PT and OT, likely patient may need rehab but SNF will not take him if homeless  CRP/Sed rate were high but MRI neg for effusion/infection, CRP trending down may have been high initially if amphetamine use (no UDS collected on admission) Correcting his underlying source of pain is goal - will work on hospital to hospital transfer - given social issues and pain control challenge, he is not safe at this time for outpatient discharge to self-care, but SNF will not take him if homeless.  MRI negative for effusion, infection seems unlikely, trend CRP Pain control --> started fentanyl TD, making oxy prn, d/c dilaudid. Pt is aware the fentanyl is temporary until he can get hip repaired, at which point anticipate tapering off / following up with pain management and possibly w/ addiction recovery care.  Consider titrating up on fentanyl patch when able Anticipate LLOS d/t  social issues and inability to provide needed treatment here for hip / inability thus far to secure transfer to tertiary care    Mild rhabdomyolysis -resolved D/c supplemental IV fluid   Alcohol abuse Continue thiamine 100 mg daily.  Continue CIWA and seizure precaution.    Polysubstance use disorder Urine toxicology obtain last month was positive for amphetamine and benzodiazepine Patient has been counseled on cessation   Tobacco abuse Continue nicotine patch.    GERD Continue PPI therapy Aspiration precaution.    Hypokalemia Replace as needed Monitor BMP  Hyponatremia likely hypovolemic - improved  Monitor sodium levels closely D/c IVF   Maculopapular rash, generalized Resolved.        DVT prophylaxis: lovenox  Pertinent IV fluids/nutrition: No continuous IV fluids Central lines / invasive devices: None  Code Status: Full code ACP documentation reviewed: 01/10/2023, none on file  Current Admission Status: Inpatient TOC needs / Dispo plan: Uncertain whether we will be able to transfer him to tertiary care center for specialized surgical intervention, may need to discharge to SNF with outpatient follow-up Barriers to discharge / significant pending items: No significant medical concern at this time but orthopedic/pain control and appropriate follow-up are a challenge at this time, see note above             Subjective / Brief ROS:  Reports severe pain no change  denies CP/SOB. Denies new weakness.  Tolerating diet.  Reports no concerns w/ urination/defecation.   Family Communication: none at this time     Objective Findings:  Vitals:   01/15/23 1547 01/15/23 2325 01/16/23 0757 01/16/23 1542  BP: 132/84 121/89 130/83 137/79  Pulse: 98 83 87 92  Resp: 16 20 17 17   Temp: 98.6 F (37 C) 98.1 F (36.7 C) 98.3 F (36.8 C) 98 F (36.7 C)  TempSrc:      SpO2: 98% 96% 98% 98%  Weight:      Height:        Intake/Output Summary (Last 24 hours)  at 01/16/2023 1716 Last data filed at 01/16/2023 1500 Gross per 24 hour  Intake --  Output 1650 ml  Net -1650 ml   Filed Weights   01/08/23 1225  Weight: 99.8 kg    Examination:  Physical Exam Constitutional:      General: He is not in acute distress.    Appearance: He is not ill-appearing or toxic-appearing.  Cardiovascular:     Rate and Rhythm: Normal rate and regular rhythm.  Pulmonary:     Effort: Pulmonary effort is normal.     Breath sounds: Normal breath sounds.  Abdominal:     General: Abdomen is flat. Bowel sounds are normal.     Palpations: Abdomen is soft.  Musculoskeletal:     Right lower leg: No edema.     Left lower leg: No edema.  Skin:    General: Skin is warm and dry.  Neurological:     General: No focal deficit present.     Mental Status: He is alert and oriented to person, place, and time.  Psychiatric:        Behavior:  Behavior normal.          Scheduled Medications:   enoxaparin (LOVENOX) injection  0.5 mg/kg Subcutaneous Q24H   fentaNYL  1 patch Transdermal Q72H   naproxen  500 mg Oral BID WC   nicotine  21 mg Transdermal Daily   pantoprazole  40 mg Oral BID   polyethylene glycol  17 g Oral Daily   senna-docusate  2 tablet Oral Daily   sodium chloride flush  3 mL Intravenous Q12H   thiamine  100 mg Oral Daily    Continuous Infusions:   PRN Medications:  oxyCODONE  Antimicrobials from admission:  Anti-infectives (From admission, onward)    None           Data Reviewed:  I have personally reviewed the following...  CBC: Recent Labs  Lab 01/10/23 0441 01/15/23 0457 01/16/23 0811  WBC 13.7* 11.2* 10.1  NEUTROABS 11.0*  --   --   HGB 11.9* 12.2* 12.2*  HCT 34.0* 36.1* 35.7*  MCV 80.4 81.9 81.3  PLT 314 597* 635*   Basic Metabolic Panel: Recent Labs  Lab 01/10/23 0441 01/15/23 0457 01/16/23 0811  NA 134* 133* 135  K 3.4* 4.1 4.2  CL 105 97* 100  CO2 25 25 25   GLUCOSE 107* 106* 114*  BUN 13 27* 34*   CREATININE 0.62 0.79 0.76  CALCIUM 8.3* 9.2 9.2   GFR: Estimated Creatinine Clearance: 158.1 mL/min (by C-G formula based on SCr of 0.76 mg/dL). Liver Function Tests: No results for input(s): "AST", "ALT", "ALKPHOS", "BILITOT", "PROT", "ALBUMIN" in the last 168 hours.  No results for input(s): "LIPASE", "AMYLASE" in the last 168 hours. No results for input(s): "AMMONIA" in the last 168 hours. Coagulation Profile: No results for input(s): "INR", "PROTIME" in the last 168 hours. Cardiac Enzymes: Recent Labs  Lab 01/10/23 0441 01/14/23 0429  CKTOTAL 19* 11*   BNP (last 3 results) No results for input(s): "PROBNP" in the last 8760 hours. HbA1C: No results for input(s): "HGBA1C" in the last 72 hours. CBG: No results for input(s): "GLUCAP" in the last 168 hours. Lipid Profile: No results for input(s): "CHOL", "HDL", "LDLCALC", "TRIG", "CHOLHDL", "LDLDIRECT" in the last 72 hours. Thyroid Function Tests: No results for input(s): "TSH", "T4TOTAL", "FREET4", "T3FREE", "THYROIDAB" in the last 72 hours. Anemia Panel: No results for input(s): "VITAMINB12", "FOLATE", "FERRITIN", "TIBC", "IRON", "RETICCTPCT" in the last 72 hours. Most Recent Urinalysis On File:     Component Value Date/Time   COLORURINE STRAW (A) 12/17/2022 2153   APPEARANCEUR CLEAR (A) 12/17/2022 2153   LABSPEC 1.010 12/17/2022 2153   PHURINE 5.0 12/17/2022 2153   GLUCOSEU NEGATIVE 12/17/2022 2153   HGBUR SMALL (A) 12/17/2022 2153   BILIRUBINUR NEGATIVE 12/17/2022 2153   KETONESUR NEGATIVE 12/17/2022 2153   PROTEINUR NEGATIVE 12/17/2022 2153   UROBILINOGEN 0.2 01/30/2012 0100   NITRITE NEGATIVE 12/17/2022 2153   LEUKOCYTESUR NEGATIVE 12/17/2022 2153   Sepsis Labs: @LABRCNTIP (procalcitonin:4,lacticidven:4) Microbiology: No results found for this or any previous visit (from the past 240 hour(s)).    Radiology Studies last 3 days: No results found.           LOS: 8 days    Time spent: 50  min   Sunnie Nielsen, DO [facility] 01/16/2023, 5:16 PM    Dictation software may have been used to generate the above note. Typos may occur and escape review in typed/dictated notes. Please contact Dr Lyn Hollingshead directly for clarity if needed.  Staff may message me via secure chat in Epic  but this may not receive an immediate response,  please page me for urgent matters!  If 7PM-7AM, please contact night coverage [email or web address]

## 2023-01-16 NOTE — Progress Notes (Signed)
PT Cancellation Note  Patient Details Name: [name] MRN: [id number] DOB: [birth year]   Cancelled Treatment:    Reason Eval/Treat Not Completed: Pain limiting ability to participate  Pt continues to refuse therapy interventions due to pain.  Pt has not participated in any functional PT interventions at this time.  Will attempt x 1 more time and discharge from PT services if he is not able to participate.   Danielle Dess 01/16/2023, 11:09 AM

## 2023-01-16 NOTE — Plan of Care (Signed)
  Problem: Elimination: Goal: Will not experience complications related to bowel motility Outcome: Progressing   Problem: Activity: Goal: Risk for activity intolerance will decrease Outcome: Refusing mobility through shift   Problem: Nutrition: Goal: Adequate nutrition will be maintained Outcome: Progressing   Problem: Coping: Problem: Pain Managment: Goal: General experience of comfort will improve Outcome: Progressing   Problem: Safety: Goal: Ability to remain free from injury will improve Outcome: Progressing   Problem: Skin Integrity: Goal: Risk for impaired skin integrity will decrease Outcome: Progressing   Goal: Level of anxiety will decrease Outcome: Progressing   Problem: Coping: Goal: Level of anxiety will decrease Outcome: Progressing

## 2023-01-16 NOTE — Plan of Care (Signed)

## 2023-01-17 DIAGNOSIS — M79605 Pain in left leg: Secondary | ICD-10-CM | POA: Diagnosis not present

## 2023-01-17 NOTE — Progress Notes (Signed)
Progress Note   Patient: [name] EXB:[id number] DOB: [birth year] DOA: 01/08/2023     9 DOS: the patient was seen and examined on 01/17/2023   Subjective: Patient seen and examined at bedside this morning Requesting for up titration of his pain medication I discussed options of going home and following up with orthopedics if pain is controlled however patient tells me he is not even able to lift the left lower extremity and he believes he will not be able to go home I understand his options of going to a facility are limited He denied nausea vomiting abdominal pain chest pain or cough  Brief Narrative / Hospital Course:  [name] is a 32 y.o. male with medical history significant for left hip pain so severe that he can't lift his left leg.  Patient does have a history of drug abuse and alcohol abuse. He has a history of undergoing ORIF of the left acetabulum over 5 years ago at [facility].  Of note, was recently discharged on July 2 after having pneumomediastinum which was evaluated with CT chest and then an upper GI barium swallow which was negative for rupture or perforation, patient had acute kidney injury anion gap metabolic acidosis alcohol abuse altered mental status leukocytosis thrombocytosis attributed to reactive nature and polysubstance abuse.   07/22: Presented to the emergency room with complaint of left hip pain.  Admitted to hospitalist service. DVT scan was negative. 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  10/27: Seen by orthopedics, CT of the hip, patient was initially refusing CT but did end up getting it done. 07/24: Per Dr. Allena Olson, orthopedics, patient will require likely complicated left hip arthroplasty with orthopedic subspecialist possibly at [facility] or [facility].  Pain control remains a challenge. Somnolent w/ dilaudid. Scheduled oxycodone, APAP, naproxen 07/25:  reduced dilaudid dose/frequency.  Increased po oxycodone dose. Goal to correct his underlying source of pain - given social issues and pain control challenge, he is not safe at this time for outpatient discharge and SNF will not take him if homeless. Ortho on call at [facility] not aware of anyone there who can perform the necessary surgery. Will reach out to [facility] and [facility] to try to transfer or otherwise arrange appropriate follow-up 07/26: long discussion w/ patient and his brother today - reviewed plan for ideally transfer but this is up to [facility], backup plan for taper off IV opiates and discharge to home/SNF to follow outpatient. At this point, he cannot ambulate  07/27: [facility] unable to take him.  07/28: Will reach out to [facility] and [facility] Monday to see about arranging outpatient follow-up. Working on tapering off IV pain control, have consulted pharmacy to confirm MME and safe transition to fentanyl patches. Pt is aware this is temporary measure until he can get hip repaired, at which point anticipate tapering off / following up with pain management and possibly w/ addiction recovery care.  07/29: called [facility], waiting to hear back. Currently on fentanyl patch 25 mcg, still using oxydocone 15 mg po q4h prn, OFF dilaudid, added back Naproxen today        ASSESSMENT & PLAN:   Principal Problem:   Acute leg pain, left Active Problems:   Rhabdomyolysis   Maculopapular rash, generalized   Hyponatremia   Hypokalemia   Heartburn   Elevated CK   Tobacco abuse   Alcohol abuse   Left hip  pain   Acute left hip pain likely secondary to severe osteoarthritis in the setting of fractured hardware Severe degenerative changes of the left hip joint with remodeling of the acetabulum and femoral head x-ray of the hip showed moderate to severe posttraumatic degenerative change involving the left hip and one of the screws involving the medial plate is fractured also slight interval increase in the displacement of the  fragment of the left fractured screw Continue PT OT Likely will need rehab if at all he will qualify Correcting his underlying source of pain is goal - will work on hospital to hospital transfer - given social issues and pain control challenge, he is not safe at this time for outpatient discharge to self-care, but SNF will not take him if homeless.  MRI negative for effusion, infection seems unlikely Pain control --> started fentanyl TD, making oxy prn, d/c dilaudid. Pt is aware this is limited-time measure until he can get hip repaired, at which point anticipate tapering off / following up with pain management and possibly w/ addiction recovery care.  Anticipate LLOS d/t social issues and inability to provide needed treatment here for hip / inability thus far to secure transfer to tertiary care    Mild rhabdomyolysis -resolved D/c supplemental IV fluid   Alcohol abuse Continue thiamine 100 mg daily.  Continue CIWA protocol and seizure precaution.    Polysubstance use disorder Urine toxicology obtain last month was positive for amphetamine and benzodiazepine Patient has been counseled on cessation   Tobacco abuse Continue nicotine patch.    GERD Continue PPI therapy Aspiration precaution.    Hypokalemia Replace as needed Monitor BMP   Hyponatremia likely hypovolemic - improved  Monitor sodium levels closely IV fluid discontinued   Maculopapular rash, generalized Resolved.        DVT prophylaxis: lovenox    Current Admission Status: Inpatient  Patient is not a safe discharge as he continues to require several pain medications needing control before discharge       Family Communication: none at this time      Physical Exam Constitutional: Young male in no acute distress Cardiovascular:     Rate and Rhythm: Normal rate and rhythm Pulmonary: Clear to auscultation bilaterally Abdominal:     General: Abdomen is flat. Bowel sounds are normal.     Palpations: Abdomen  is soft.  Musculoskeletal:     Right lower leg: No edema.     Left lower leg: No edema.  Skin:    General: Skin is warm and dry.  Neurological:     General: No focal deficit present.     Mental Status: He is alert and oriented to person, place, and time.  Psychiatric:        Behavior: Behavior normal.       Data Reviewed: Have reviewed patient's labs, vitals, nursing documentation, previous provider documentation as well as PT OT note   Vitals:   01/16/23 1542 01/16/23 2331 01/17/23 0813 01/17/23 1453  BP: 137/79 133/82 130/84 (!) 123/91  Pulse: 92 87 81 (!) 107  Resp: 17 18 14 18   Temp: 98 F (36.7 C) 97.7 F (36.5 C) 98 F (36.7 C) 98.3 F (36.8 C)  TempSrc:      SpO2: 98% 98% 100% 99%  Weight:      Height:        Author: Loyce Dys, MD 01/17/2023 4:43 PM  For on call review [email or web address].

## 2023-01-17 NOTE — Plan of Care (Signed)

## 2023-01-17 NOTE — Progress Notes (Signed)
PT Cancellation Note  Patient Details Name: [name] MRN: [id number] DOB: 07/25/1990   Cancelled Treatment:    Reason Eval/Treat Not Completed: Other (comment);Pain limiting ability to participate Offered limited bed exercises/mobility tasks and pt adamant that his hip simply hurts too much and "I'm not going to put myself through that much pain, I'm telling you it's not gonna happen... not worth it."  Nursing mentioned timing with additional pain meds, etc and pt continued to adamantly refuse.  Will try back one more time tomorrow, but seems apparent that he will continue to be resistant to doing anything until he has surgery- will likely complete orders at that time.  Malachi Pro, DPT 01/17/2023, 11:46 AM

## 2023-01-18 DIAGNOSIS — M79605 Pain in left leg: Secondary | ICD-10-CM | POA: Diagnosis not present

## 2023-01-18 NOTE — Progress Notes (Signed)
PT Cancellation Note  Patient Details Name: [name] MRN: [id number] DOB: 03-Sep-1990   Cancelled Treatment:    Reason Eval/Treat Not Completed: Other (comment) Yet again offered to work with PT.  He continues to endorse severe pain and that he won't put himself through more pain with moving.  When encouraged to do a little he agrees that not getting up form more than a week is not great for him but flatly refuses to try. Will complete PT orders at this as he has refused 5 days in a row with continued adamant refusal and continued severity of pain.    Malachi Pro, DPT 01/18/2023, 5:11 PM

## 2023-01-18 NOTE — TOC Progression Note (Signed)
Transition of Care [facility]) - Progression Note    Patient Details  Name: NIRAV SEMMLER MRN: [id number] Date of Birth: [birth year]  Transition of Care [facility]) CM/SW Contact  Marlowe Sax, RN Phone Number: 01/18/2023, 10:15 AM  Clinical Narrative:     TOC continues to follow the patient, he plans to DC to his mothers at DC Having issues with Pain,  Not working with Therapy Expected Discharge Plan: Home/Self Care Barriers to Discharge: Continued Medical Work up  Expected Discharge Plan and Services   Discharge Planning Services: CM Consult   Living arrangements for the past 2 months: Single Family Home                                       Social Determinants of Health (SDOH) Interventions SDOH Screenings   Food Insecurity: No Food Insecurity (01/09/2023)  Housing: Medium Risk (12/17/2022)  Transportation Needs: Unmet Transportation Needs (12/17/2022)  Utilities: Not At Risk (12/17/2022)  Tobacco Use: High Risk (01/08/2023)    Readmission Risk Interventions     No data to display

## 2023-01-18 NOTE — Plan of Care (Addendum)
Per 3rd shift report - prior staff has been struggling with keeping patient safe while here.  Based on inbound drug screen and hx, patient has not been given an IV.  However, patient has had at least 1 visitor daily to bring new vapes and "not sure what else" to the bedside.  2 new vapes were removed from room last night and this is not the first time. Per report also, Patient stated, "I have friends who will bring me whatever I want and need."  As of this morning , for the safety of patient and staff, security has been informed and NO visitors should be allowed at bedside.  Front desk informed also.  Please help with this process.  Mom was notified of new visitors change.  She understands our situation and agrees that it should be done if needed.  1345 - Patient learned that he could NO longer have visitors and requested to speak with his nurse.  This nurse responded and explained, " you have had things brought to the hospital that were safety concerns and could cause harm to you while you are in our care".  Patient stated, " why wasn't I given a warning?"  I clarified, "We've removed these items (vapes), 2 different times, of which YOU responded, "I can get my friends to bring in anything I want!"  - Those were your warnings.  Patient claimed," I will talk to the Dr. to get my visitors back tomorrow."  [phone] - Per [facility] safety coordinator - we were informed that "stopping patient's visitors" would be a violation of patient's right's.  Therefore, leadership suggested placing tele sitter in room (which patient agreed to). Tele sitter ordered and requested - will be placed in room prior to finalizing shift change.  Front desk and mom also contact to resume visitors (according to current policy).  TY for everyone's assistance - main concern is to keep the patient safe.

## 2023-01-18 NOTE — Plan of Care (Signed)

## 2023-01-18 NOTE — Progress Notes (Signed)
Progress Note   Patient: [name]:[id number] DOB: 08-Aug-1990 DOA: 01/08/2023     10 DOS: the patient was seen and examined on 01/18/2023      Subjective: Patient seen and examined at bedside this morning Apparently patient had visitors bringing him some drugs and vaping We have spoken to him about not allowing this He denies nausea vomiting chest pain cough Admits to pain in the left hip area  Brief Narrative / Hospital Course:  Houchen is a 32 y.o. male with medical history significant for left hip pain so severe that he can't lift his left leg.  Patient does have a history of drug abuse and alcohol abuse. He has a history of undergoing ORIF of the left acetabulum over 5 years ago at [facility].  Of note, was recently discharged on July 2 after having pneumomediastinum which was evaluated with CT chest and then an upper GI barium swallow which was negative for rupture or perforation, patient had acute kidney injury anion gap metabolic acidosis alcohol abuse altered mental status leukocytosis thrombocytosis attributed to reactive nature and polysubstance abuse.   07/22: Presented to the emergency room with complaint of left hip pain.  Admitted to hospitalist service. DVT scan was negative. 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  81/19: Seen by orthopedics, CT of the hip, patient was initially refusing CT but did end up getting it done. 07/24: Per Dr. Allena Katz, orthopedics, patient will require likely complicated left hip arthroplasty with orthopedic subspecialist possibly at [facility] or [facility].  Pain control remains a challenge. Somnolent w/ dilaudid. Scheduled oxycodone, APAP, naproxen 07/25: reduced dilaudid dose/frequency.  Increased po oxycodone dose. Goal to correct his underlying source of pain - given social issues and pain control challenge, he is not safe at this time for  outpatient discharge and SNF will not take him if homeless. Ortho on call at [facility] not aware of anyone there who can perform the necessary surgery. Will reach out to [facility] and [facility] to try to transfer or otherwise arrange appropriate follow-up 07/26: long discussion w/ patient and his brother today - reviewed plan for ideally transfer but this is up to [facility], backup plan for taper off IV opiates and discharge to home/SNF to follow outpatient. At this point, he cannot ambulate  07/27: [facility] unable to take him.  07/28: Will reach out to [facility] and [facility] Monday to see about arranging outpatient follow-up. Working on tapering off IV pain control, have consulted pharmacy to confirm MME and safe transition to fentanyl patches. Pt is aware this is temporary measure until he can get hip repaired, at which point anticipate tapering off / following up with pain management and possibly w/ addiction recovery care.  07/29: called [facility], waiting to hear back. Currently on fentanyl patch 25 mcg, still using oxydocone 15 mg po q4h prn, OFF dilaudid, added back Naproxen today        ASSESSMENT & PLAN:   Principal Problem:   Acute leg pain, left Active Problems:   Rhabdomyolysis   Maculopapular rash, generalized   Hyponatremia   Hypokalemia   Heartburn   Elevated CK   Tobacco abuse   Alcohol abuse   Left hip pain   Acute left hip pain likely secondary to severe osteoarthritis in the setting of fractured hardware Severe degenerative changes of the left hip joint with remodeling of the acetabulum and femoral  head x-ray of the hip showed moderate to severe posttraumatic degenerative change involving the left hip and one of the screws involving the medial plate is fractured also slight interval increase in the displacement of the fragment of the left fractured screw Continue PT OT although patient is refusing Transition of care manager working on possible placement if any Continue current pain regiment MRI negative  for effusion, infection seems unlikely Anticipate LLOS d/t social issues and inability to provide needed treatment here for hip / inability thus far to secure transfer to tertiary care    Mild rhabdomyolysis -resolved D/c supplemental IV fluid   Alcohol abuse Continue hiamine 100 mg daily.  Continue CIWA protocol and seizure precaution   Polysubstance use disorder Urine toxicology obtain last month was positive for amphetamine and benzodiazepine Patient has been counseled on cessation   Tobacco abuse Continue nicotine patch.    GERD Continue PPI therapy   Hypokalemia-improved Continue repletion and monitoring   Hyponatremia likely hypovolemic - improved  Monitor sodium levels closely IV fluid discontinued   Maculopapular rash, generalized Resolved.        DVT prophylaxis: lovenox    Current Admission Status: Inpatient   Patient is not a safe discharge as he continues to require several pain medications needing control before discharge       Family Communication: none at this time      Physical Exam Constitutional: Young male laying in bed sleeping but wakes up to request for pain meds Cardiovascular:     Rate and Rhythm: Normal rate and rhythm Pulmonary: Clear to auscultation bilaterally Abdominal:     General: Abdomen is flat. Bowel sounds are normal.     Palpations: Abdomen is soft.  Musculoskeletal:     Right lower leg: No edema.     Left lower leg: No edema.  Skin:    General: Skin is warm and dry.  Neurological:     General: No focal deficit present.     Mental Status: He is alert and oriented to person, place, and time.  Psychiatric:        Behavior: Behavior normal.       Data Reviewed: Have reviewed patient's labs, vitals, nursing documentation, PT OT documentation as well as case managers documentation  Vitals:   01/16/23 2331 01/17/23 0813 01/17/23 1453 01/17/23 2344  BP: 133/82 130/84 (!) 123/91 114/83  Pulse: 87 81 (!) 107 81  Resp: 18  14 18 18   Temp: 97.7 F (36.5 C) 98 F (36.7 C) 98.3 F (36.8 C) 98.6 F (37 C)  TempSrc:      SpO2: 98% 100% 99% 96%  Weight:      Height:        Author: Loyce Dys, MD 01/18/2023 4:26 PM  For on call review [email or web address].

## 2023-01-19 DIAGNOSIS — M79605 Pain in left leg: Secondary | ICD-10-CM | POA: Diagnosis not present

## 2023-01-19 NOTE — TOC Progression Note (Addendum)
Transition of Care [facility]) - Progression Note    Patient Details  Name: BRYSEN SHANKMAN MRN: [id number] Date of Birth: 1991-04-14  Transition [facility] CM/SW Contact  Marlowe Sax, RN Phone Number: 01/19/2023, 9:07 AM  Clinical Narrative:   TOC continues to follow for needs, As long as the patient refuses to work with PT then discharge planning can not be achieved.  With his Ins he will not be able to go to STR, CIR or any other rehab, as they all  requires him to participate with therapy.  He reports that he will go to his mother's at [place], however; he states that he can not move due to pain. It has been explained to him in many occasions that laying in the bed not moving will essentially cause more issues.  He still refuses.  NO DC plan at this time    Expected Discharge Plan: Home/Self Care Barriers to Discharge: Continued Medical Work up  Expected Discharge Plan and Services   Discharge Planning Services: CM Consult   Living arrangements for the past 2 months: Single Family Home                                       Social Determinants of Health (SDOH) Interventions SDOH Screenings   Food Insecurity: No Food Insecurity (01/09/2023)  Housing: Medium Risk (12/17/2022)  Transportation Needs: Unmet Transportation Needs (12/17/2022)  Utilities: Not At Risk (12/17/2022)  Tobacco Use: High Risk (01/08/2023)    Readmission Risk Interventions     No data to display

## 2023-01-19 NOTE — Plan of Care (Signed)

## 2023-01-19 NOTE — Progress Notes (Signed)
Progress Note   Patient: [name] [id number] DOB: May 13, 1991 DOA: 01/08/2023     11 DOS: the patient was seen and examined on 01/19/2023       Subjective: Patient seen and examined at bedside this morning No have any acute overnight event We will see if patient will work with physical therapist today Has placement is pending his ability to work with therapy Transition of care manager coordinating discharge planning  Brief Narrative / Hospital Course:  Awan is a 32 y.o. male with medical history significant for left hip pain so severe that he can't lift his left leg.  Patient does have a history of drug abuse and alcohol abuse. He has a history of undergoing ORIF of the left acetabulum over 5 years ago at [facility]  Of note, was recently discharged on July 2 after having pneumomediastinum which was evaluated with CT chest and then an upper GI barium swallow which was negative for rupture or perforation, patient had acute kidney injury anion gap metabolic acidosis alcohol abuse altered mental status leukocytosis thrombocytosis attributed to reactive nature and polysubstance abuse.   07/22: Presented to the emergency room with complaint of left hip pain.  Admitted to hospitalist service. DVT scan was negative. 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  64/40: Seen by orthopedics, CT of the hip, patient was initially refusing CT but did end up getting it done. 07/24: Per Dr. Allena Katz, orthopedics, patient will require likely complicated left hip arthroplasty with orthopedic subspecialist possibly at [facility] or [facility].  Pain control remains a challenge. Somnolent w/ dilaudid. Scheduled oxycodone, APAP, naproxen 07/25: reduced dilaudid dose/frequency.  Increased po oxycodone dose. Goal to correct his underlying source of pain - given social issues and pain control challenge, he is not  safe at this time for outpatient discharge and SNF will not take him if homeless. Ortho on call at [facility] not aware of anyone there who can perform the necessary surgery. Will reach out to [facility] and [facility] to try to transfer or otherwise arrange appropriate follow-up 07/26: long discussion w/ patient and his brother today - reviewed plan for ideally transfer but this is up to [facility], backup plan for taper off IV opiates and discharge to home/SNF to follow outpatient. At this point, he cannot ambulate  07/27: [facility] unable to take him.  07/28: Will reach out to [facility] and [facility] Monday to see about arranging outpatient follow-up. Working on tapering off IV pain control, have consulted pharmacy to confirm MME and safe transition to fentanyl patches. Pt is aware this is temporary measure until he can get hip repaired, at which point anticipate tapering off / following up with pain management and possibly w/ addiction recovery care.  07/29: called [facility], waiting to hear back. Currently on fentanyl patch 25 mcg, still using oxydocone 15 mg po q4h prn, OFF dilaudid, added back Naproxen today        ASSESSMENT & PLAN:   Principal Problem:   Acute leg pain, left Active Problems:   Rhabdomyolysis   Maculopapular rash, generalized   Hyponatremia   Hypokalemia   Heartburn   Elevated CK   Tobacco abuse   Alcohol abuse   Left hip pain   Acute left hip pain likely secondary to severe osteoarthritis in the setting of fractured hardware Severe degenerative changes of the left hip joint with remodeling of the acetabulum and  femoral head x-ray of the hip showed moderate to severe posttraumatic degenerative change involving the left hip and one of the screws involving the medial plate is fractured also slight interval increase in the displacement of the fragment of the left fractured screw Continue PT OT although patient is refusing Transition of care manager working on possible placement if any Continue current pain  regiment MRI negative for effusion, infection seems unlikely We anticipate a long length of stay due to's a lot of social issues and inability to provide needed treatment here for hip hardware fracture As well as patient's lack of participation in therapy and being unable to move the legs making it difficult to discharge home   Mild rhabdomyolysis -resolved D/c supplemental IV fluid   Alcohol abuse Continue thiamine 100 mg daily.  Continue CIWA protocol and seizure precaution   Polysubstance use disorder Urine toxicology obtain last month was positive for amphetamine and benzodiazepine Patient has been counseled on cessation   Tobacco abuse Continue nicotine patch.    GERD Continue PPI therapy   Hypokalemia-improved Continue repletion and monitoring   Hyponatremia likely hypovolemic - improved  Monitor sodium levels closely IV fluid discontinued   Maculopapular rash, generalized Resolved.        DVT prophylaxis: lovenox    Current Admission Status: Inpatient   Patient is not a safe discharge as he continues to require several pain medications needing control before discharge       Family Communication: none at this time      Physical Exam Constitutional: Young male laying in bed in no distress Cardiovascular:     Rate and Rhythm: Normal rate and rhythm Pulmonary: Clear to auscultation bilaterally Abdominal:     General: Abdomen is flat. Bowel sounds are normal.     Palpations: Abdomen is soft.  Musculoskeletal:     Right lower leg: No edema.     Left lower leg: No edema.  Skin:    General: Skin is warm and dry.  Neurological:     General: No focal deficit present.     Mental Status: He is alert and oriented to person, place, and time.  Psychiatric:        Behavior: Behavior normal.       Data Reviewed: Have reviewed the patient's CBC, BMP, nursing documentation, transition of care manager documentation as well as patient's vitals  Vitals:   01/18/23  1626 01/19/23 0119 01/19/23 0744 01/19/23 1623  BP: 110/69 122/80 120/81 122/74  Pulse: 75 83 61 97  Resp:  20 16 18   Temp: 98.8 F (37.1 C) 98.3 F (36.8 C) 98.1 F (36.7 C) 98.4 F (36.9 C)  TempSrc: Oral     SpO2: 97% 97% 98% 96%  Weight:      Height:         Author: Loyce Dys, MD 01/19/2023 4:51 PM  For on call review [email or web address].

## 2023-01-20 DIAGNOSIS — M79605 Pain in left leg: Secondary | ICD-10-CM | POA: Diagnosis not present

## 2023-01-20 NOTE — Progress Notes (Signed)
Progress Note   Patient: [name] JXB:[id number] DOB: [birth year] DOA: 01/08/2023     12 DOS: the patient was seen and examined on 01/20/2023      Subjective: Patient seen and examined at bedside this morning Patient appears much better I have encouraged patient to work with physical therapist so that this can enhance placement or his ability to leave the hospital Denies chest pain nausea vomiting abdominal pain  Brief Narrative / Hospital Course:  Skora is a 32 y.o. male with medical history significant for left hip pain so severe that he can't lift his left leg.  Patient does have a history of drug abuse and alcohol abuse. He has a history of undergoing ORIF of the left acetabulum over 5 years ago at [facility].  Of note, was recently discharged on July 2 after having pneumomediastinum which was evaluated with CT chest and then an upper GI barium swallow which was negative for rupture or perforation, patient had acute kidney injury anion gap metabolic acidosis alcohol abuse altered mental status leukocytosis thrombocytosis attributed to reactive nature and polysubstance abuse.   07/22: Presented to the emergency room with complaint of left hip pain.  Admitted to hospitalist service. DVT scan was negative. 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  13/08: Seen by orthopedics, CT of the hip, patient was initially refusing CT but did end up getting it done. 07/24: Per Dr. Allena Katz, orthopedics, patient will require likely complicated left hip arthroplasty with orthopedic subspecialist possibly at [facility] or [facility].  Pain control remains a challenge. Somnolent w/ dilaudid. Scheduled oxycodone, APAP, naproxen 07/25: reduced dilaudid dose/frequency.  Increased po oxycodone dose. Goal to correct his underlying source of pain - given social issues and pain control challenge, he is not safe at this  time for outpatient discharge and SNF will not take him if homeless. Ortho on call at [facility] not aware of anyone there who can perform the necessary surgery. Will reach out to [facility] and [facility] to try to transfer or otherwise arrange appropriate follow-up 07/26: long discussion w/ patient and his brother today - reviewed plan for ideally transfer but this is up to [facility], backup plan for taper off IV opiates and discharge to home/SNF to follow outpatient. At this point, he cannot ambulate  07/27: [facility] unable to take him.  07/28: Will reach out to [facility] and [facility] Monday to see about arranging outpatient follow-up. Working on tapering off IV pain control, have consulted pharmacy to confirm MME and safe transition to fentanyl patches. Pt is aware this is temporary measure until he can get hip repaired, at which point anticipate tapering off / following up with pain management and possibly w/ addiction recovery care.  07/29: called [facility], waiting to hear back. Currently on fentanyl patch 25 mcg, still using oxydocone 15 mg po q4h prn, OFF dilaudid, added back Naproxen today        ASSESSMENT & PLAN:   Principal Problem:   Acute leg pain, left Active Problems:   Rhabdomyolysis   Maculopapular rash, generalized   Hyponatremia   Hypokalemia   Heartburn   Elevated CK   Tobacco abuse   Alcohol abuse   Left hip pain   Acute left hip pain likely secondary to severe osteoarthritis in the setting of fractured hardware Severe degenerative changes of the left hip joint with remodeling of the acetabulum and femoral head  x-ray of the hip showed moderate to severe posttraumatic degenerative change involving the left hip and one of the screws involving the medial plate is fractured also slight interval increase in the displacement of the fragment of the left fractured screw Continue PT OT although patient is refusing Transition of care manager working on possible placement if any Continue current pain regiment MRI  negative for effusion, infection seems unlikely We anticipate a long length of stay due to a lot of social issues and inability to provide needed treatment here for hip hardware fracture Patient's lack of participation in therapy and being unable to move the legs is making it difficult for discharge home. I have encouraged patient to work with physical therapist so that this can enhance placement or his ability to leave the hospital   Mild rhabdomyolysis -resolved We have discontinued IV fluids   Alcohol abuse Continue thiamine Continue CIWA precaution Continue seizure precaution   Polysubstance use disorder Urine toxicology obtain last month was positive for amphetamine and benzodiazepine Patient has been counseled on cessation   Tobacco abuse Continue nicotine patch   GERD Continue PPI therapy   Hypokalemia-improved Continue repletion and monitoring   Hyponatremia likely hypovolemic - improved  Monitor sodium levels closely IV fluid discontinued   Maculopapular rash, generalized Resolved.        DVT prophylaxis: lovenox    Current Admission Status: Inpatient   Patient is not a safe discharge as he continues to require several pain medications needing control before discharge       Family Communication: none at this time      Physical Exam Constitutional: Young male laying in bed in no distress Cardiovascular:     Rate and Rhythm: Normal rate and rhythm Pulmonary: Clear to auscultation bilaterally Abdominal:     General: Abdomen is flat. Bowel sounds are normal.     Palpations: Abdomen is soft.  Musculoskeletal:     Right lower leg: No edema.     Left lower leg: No edema.  Skin:    General: Skin is warm and dry.  Neurological:     General: No focal deficit present.     Mental Status: He is alert and oriented to person, place, and time.  Psychiatric:        Behavior: Behavior normal.       Data Reviewed: I reviewed patient's CBC, BMP, nursing  documentation, transition of care documentation    Vitals:   01/19/23 0744 01/19/23 1623 01/19/23 2341 01/20/23 0933  BP: 120/81 122/74 111/75 128/76  Pulse: 61 97 81 84  Resp: 16 18 18 18   Temp: 98.1 F (36.7 C) 98.4 F (36.9 C) 98 F (36.7 C) 98.2 F (36.8 C)  TempSrc:   Oral   SpO2: 98% 96% 96% 98%  Weight:      Height:         Author: Loyce Dys, MD 01/20/2023 2:23 PM  For on call review [email or web address].

## 2023-01-20 NOTE — Plan of Care (Signed)

## 2023-01-20 NOTE — Plan of Care (Signed)

## 2023-01-21 DIAGNOSIS — M79605 Pain in left leg: Secondary | ICD-10-CM | POA: Diagnosis not present

## 2023-01-21 LAB — BASIC METABOLIC PANEL WITH GFR
Anion gap: 9 (ref 5–15)
BUN: 33 mg/dL — ABNORMAL HIGH (ref 6–20)
CO2: 25 mmol/L (ref 22–32)
Calcium: 8.8 mg/dL — ABNORMAL LOW (ref 8.9–10.3)
Chloride: 104 mmol/L (ref 98–111)
Creatinine, Ser: 0.58 mg/dL — ABNORMAL LOW (ref 0.61–1.24)
GFR, Estimated: >60 mL/min (ref >60)
Glucose, Bld: 109 mg/dL — ABNORMAL HIGH (ref 70–99)
Potassium: 3.9 mmol/L (ref 3.5–5.1)
Sodium: 138 mmol/L (ref 135–145)

## 2023-01-21 NOTE — Progress Notes (Signed)
Progress Note   Patient: [name]:[id number] DOB: 07-08-1990 DOA: 01/08/2023     13 DOS: the patient was seen and examined on 01/21/2023     Subjective: Patient seen and examined laying in bed denies worsening pain nausea vomiting abdominal pain or urinary complaints  Brief Narrative / Hospital Course:  Krupka is a 32 y.o. male with medical history significant for left hip pain so severe that he can't lift his left leg.  Patient does have a history of drug abuse and alcohol abuse. He has a history of undergoing ORIF of the left acetabulum over 5 years ago at [facility].  Of note, was recently discharged on July 2 after having pneumomediastinum which was evaluated with CT chest and then an upper GI barium swallow which was negative for rupture or perforation, patient had acute kidney injury anion gap metabolic acidosis alcohol abuse altered mental status leukocytosis thrombocytosis attributed to reactive nature and polysubstance abuse.   07/22: Presented to the emergency room with complaint of left hip pain.  Admitted to hospitalist service. DVT scan was negative. 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  81/19: Seen by orthopedics, CT of the hip, patient was initially refusing CT but did end up getting it done. 07/24: Per Dr. Allena Katz, orthopedics, patient will require likely complicated left hip arthroplasty with orthopedic subspecialist possibly at [facility] or [facility].  Pain control remains a challenge. Somnolent w/ dilaudid. Scheduled oxycodone, APAP, naproxen 07/25: reduced dilaudid dose/frequency.  Increased po oxycodone dose. Goal to correct his underlying source of pain - given social issues and pain control challenge, he is not safe at this time for outpatient discharge and SNF will not take him if homeless. Ortho on call at [facility] not aware of anyone there who can perform the necessary  surgery. Will reach out to [facility] and [facility] to try to transfer or otherwise arrange appropriate follow-up 07/26: long discussion w/ patient and his brother today - reviewed plan for ideally transfer but this is up to [facility], backup plan for taper off IV opiates and discharge to home/SNF to follow outpatient. At this point, he cannot ambulate  07/27: [facility] unable to take him.  07/28: Will reach out to [facility] and [facility] Monday to see about arranging outpatient follow-up. Working on tapering off IV pain control, have consulted pharmacy to confirm MME and safe transition to fentanyl patches. Pt is aware this is temporary measure until he can get hip repaired, at which point anticipate tapering off / following up with pain management and possibly w/ addiction recovery care.  07/29: called [facility], waiting to hear back. Currently on fentanyl patch 25 mcg, still using oxydocone 15 mg po q4h prn, OFF dilaudid, added back Naproxen today        ASSESSMENT & PLAN:   Principal Problem:   Acute leg pain, left Active Problems:   Rhabdomyolysis   Maculopapular rash, generalized   Hyponatremia   Hypokalemia   Heartburn   Elevated CK   Tobacco abuse   Alcohol abuse   Left hip pain   Acute left hip pain likely secondary to severe osteoarthritis in the setting of fractured hardware Severe degenerative changes of the left hip joint with remodeling of the acetabulum and femoral head x-ray of the hip showed moderate to severe posttraumatic degenerative change involving the left hip and one of the screws involving the medial plate is  fractured also slight interval increase in the displacement of the fragment of the left fractured screw I have encouraged him to work with physical therapy so that this can enhance his placement no ability to leave the hospital Transition of care manager working on possible placement if any Continue current pain management MRI negative for effusion, infection seems unlikely We anticipate a  long length of stay due to a lot of social issues and inability to provide needed treatment here for hip hardware fracture Patient's lack of participation in therapy and being unable to move the legs is making it difficult for discharge home.   Mild rhabdomyolysis -resolved We have discontinued IV fluids   Alcohol abuse Continue thiamine Continue CIWA precaution Continue seizure precaution   Polysubstance use disorder Urine toxicology obtain last month was positive for amphetamine and benzodiazepine Patient has been counseled on cessation   Tobacco abuse Continue nicotine patch   GERD Continue PPI therapy   Hypokalemia-improved Continue repletion and monitoring   Hyponatremia likely hypovolemic - improved  Monitor sodium levels closely IV fluid discontinued   Maculopapular rash, generalized Resolved.        DVT prophylaxis: lovenox    Current Admission Status: Inpatient   Patient is not a safe discharge as he continues to require several pain medications needing control before discharge       Family Communication: none at this time      Physical Exam Constitutional: Young male laying in bed in no acute distress Cardiovascular:     Rate and Rhythm: Normal rate and rhythm Pulmonary: Clear to auscultation bilaterally Abdominal:     General: Abdomen is flat. Bowel sounds are normal.     Palpations: Abdomen is soft.  Musculoskeletal:     Right lower leg: No edema.     Left lower leg: No edema.  Skin:    General: Skin is warm and dry.  Neurological:     General: No focal deficit present.     Mental Status: He is alert and oriented to person, place, and time.  Psychiatric:        Behavior: Behavior normal.       Data Reviewed: I have reviewed patient's CBC, BMP, nursing documentation as well as transition of care manager's documentation  Vitals:   01/20/23 0933 01/20/23 1538 01/21/23 0133 01/21/23 0817  BP: 128/76 127/80 108/72 107/68  Pulse: 84 88 75 80   Resp: 18 16 16 15   Temp: 98.2 F (36.8 C) 98.1 F (36.7 C) 98.3 F (36.8 C) 97.7 F (36.5 C)  TempSrc:      SpO2: 98% 97% 95% 97%  Weight:      Height:         Author: Loyce Dys, MD 01/21/2023 3:22 PM  For on call review [email or web address].

## 2023-01-21 NOTE — Plan of Care (Signed)
  Problem: Clinical Measurements: ?Goal: Ability to maintain clinical measurements within normal limits will improve ?Outcome: Progressing ?Goal: Will remain free from infection ?Outcome: Progressing ?Goal: Diagnostic test results will improve ?Outcome: Progressing ?Goal: Respiratory complications will improve ?Outcome: Progressing ?Goal: Cardiovascular complication will be avoided ?Outcome: Progressing ?  ?Problem: Nutrition: ?Goal: Adequate nutrition will be maintained ?Outcome: Progressing ?  ?Problem: Elimination: ?Goal: Will not experience complications related to bowel motility ?Outcome: Progressing ?Goal: Will not experience complications related to urinary retention ?Outcome: Progressing ?  ?Problem: Safety: ?Goal: Ability to remain free from injury will improve ?Outcome: Progressing ?  ?Problem: Skin Integrity: ?Goal: Risk for impaired skin integrity will decrease ?Outcome: Progressing ?  ?

## 2023-01-21 NOTE — Plan of Care (Signed)
  Problem: Education: Goal: Knowledge of General Education information will improve Description Including pain rating scale, medication(s)/side effects and non-pharmacologic comfort measures Outcome: Progressing   Problem: Nutrition: Goal: Adequate nutrition will be maintained Outcome: Progressing   Problem: Coping: Goal: Level of anxiety will decrease Outcome: Progressing   Problem: Elimination: Goal: Will not experience complications related to urinary retention Outcome: Progressing   Problem: Safety: Goal: Ability to remain free from injury will improve Outcome: Progressing   Problem: Skin Integrity: Goal: Risk for impaired skin integrity will decrease Outcome: Progressing

## 2023-01-22 NOTE — Plan of Care (Signed)

## 2023-01-22 NOTE — TOC Progression Note (Signed)
Transition of Care [facility]) - Progression Note    Patient Details  Name: [name] MRN: [id number] Date of Birth: 10/11/1990  Transition of Care [facility]) CM/SW Contact  Marlowe Sax, RN Phone Number: 01/22/2023, 9:59 AM  Clinical Narrative:     We anticipate a long length of stay due to a lot of social issues and inability to provide needed treatment here for hip hardware fracture Patient's lack of participation in therapy and being unable to move the legs is making it difficult for discharge home. Unable to place if not working with therapy, only placement option if working with therapy is [facility]  Expected Discharge Plan: Home/Self Care Barriers to Discharge: Continued Medical Work up  Expected Discharge Plan and Services   Discharge Planning Services: CM Consult   Living arrangements for the past 2 months: Single Family Home                                       Social Determinants of Health (SDOH) Interventions SDOH Screenings   Food Insecurity: No Food Insecurity (01/09/2023)  Housing: Medium Risk (12/17/2022)  Transportation Needs: Unmet Transportation Needs (12/17/2022)  Utilities: Not At Risk (12/17/2022)  Tobacco Use: High Risk (01/08/2023)    Readmission Risk Interventions     No data to display

## 2023-01-22 NOTE — Plan of Care (Signed)
°  Problem: Education: °Goal: Knowledge of General Education information will improve °Description: Including pain rating scale, medication(s)/side effects and non-pharmacologic comfort measures °Outcome: Progressing °  °Problem: Health Behavior/Discharge Planning: °Goal: Ability to manage health-related needs will improve °Outcome: Progressing °  °Problem: Clinical Measurements: °Goal: Ability to maintain clinical measurements within normal limits will improve °Outcome: Progressing °Goal: Will remain free from infection °Outcome: Progressing °Goal: Diagnostic test results will improve °Outcome: Progressing °Goal: Respiratory complications will improve °Outcome: Progressing °Goal: Cardiovascular complication will be avoided °Outcome: Progressing °  °Problem: Nutrition: °Goal: Adequate nutrition will be maintained °Outcome: Progressing °  °Problem: Coping: °Goal: Level of anxiety will decrease °Outcome: Progressing °  °Problem: Elimination: °Goal: Will not experience complications related to bowel motility °Outcome: Progressing °Goal: Will not experience complications related to urinary retention °Outcome: Progressing °  °Problem: Safety: °Goal: Ability to remain free from injury will improve °Outcome: Progressing °  °Problem: Skin Integrity: °Goal: Risk for impaired skin integrity will decrease °Outcome: Progressing °  °

## 2023-01-22 NOTE — Progress Notes (Signed)
Progress Note   Patient: [name] JXB:[id number] DOB: 1990/12/30 DOA: 01/08/2023     14 DOS: the patient was seen and examined on 01/22/2023     Subjective: Patient seen and examined laying in bed denies worsening pain nausea vomiting abdominal pain or urinary complaints  Brief Narrative / Hospital Course:  Mayon is a 32 y.o. male with medical history significant for left hip pain so severe that he can't lift his left leg.  Patient does have a history of drug abuse and alcohol abuse. He has a history of undergoing ORIF of the left acetabulum over 5 years ago at [facility].  Of note, was recently discharged on July 2 after having pneumomediastinum which was evaluated with CT chest and then an upper GI barium swallow which was negative for rupture or perforation, patient had acute kidney injury anion gap metabolic acidosis alcohol abuse altered mental status leukocytosis thrombocytosis attributed to reactive nature and polysubstance abuse.   07/22: Presented to the emergency room with complaint of left hip pain.  Admitted to hospitalist service. DVT scan was negative. 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  13/08: Seen by orthopedics, CT of the hip, patient was initially refusing CT but did end up getting it done. 07/24: Per Dr. Allena Katz, orthopedics, patient will require likely complicated left hip arthroplasty with orthopedic subspecialist possibly at [facility] or [facility].  Pain control remains a challenge. Somnolent w/ dilaudid. Scheduled oxycodone, APAP, naproxen 07/25: reduced dilaudid dose/frequency.  Increased po oxycodone dose. Goal to correct his underlying source of pain - given social issues and pain control challenge, he is not safe at this time for outpatient discharge and SNF will not take him if homeless. Ortho on call at [facility] not aware of anyone there who can perform the necessary  surgery. Will reach out to [facility] and [facility] to try to transfer or otherwise arrange appropriate follow-up 07/26: long discussion w/ patient and his brother today - reviewed plan for ideally transfer but this is up to [facility], backup plan for taper off IV opiates and discharge to home/SNF to follow outpatient. At this point, he cannot ambulate  07/27: [facility] unable to take him.  07/28: Will reach out to [facility] and [facility] Monday to see about arranging outpatient follow-up. Working on tapering off IV pain control, have consulted pharmacy to confirm MME and safe transition to fentanyl patches. Pt is aware this is temporary measure until he can get hip repaired, at which point anticipate tapering off / following up with pain management and possibly w/ addiction recovery care.  07/29: called [facility], waiting to hear back. Currently on fentanyl patch 25 mcg, still using oxydocone 15 mg po q4h prn, OFF dilaudid, added back Naproxen today  7/30-8/5: Patient was continued on pain medication, still continued to refuse physical therapy although I offered to give additional pain relief prior to physical therapy, I reached out to Dr. Oley Balm office [facility] specialist orthopedics at [phone] and left voice message as they were unable to answer. I have encouraged patient to continue to work with therapy and to let me know if he needs any additional pain medication to help with this        ASSESSMENT & PLAN:   Principal Problem:   Acute leg pain, left Active Problems:   Rhabdomyolysis   Maculopapular rash, generalized   Hyponatremia   Hypokalemia   Heartburn  Elevated CK   Tobacco abuse   Alcohol abuse   Left hip pain   Acute left hip pain likely secondary to severe osteoarthritis in the setting of fractured hardware Severe degenerative changes of the left hip joint with remodeling of the acetabulum and femoral head x-ray of the hip showed moderate to severe posttraumatic degenerative change involving the  left hip and one of the screws involving the medial plate is fractured also slight interval increase in the displacement of the fragment of the left fractured screw I have encouraged him to work with physical therapy so that this can enhance his placement no ability to leave the hospital Transition of care manager working on possible placement if any Continue current pain management MRI negative for effusion, infection seems unlikely We anticipate a long length of stay due to a lot of social issues and inability to provide needed treatment here for hip hardware fracture Patient's lack of participation in therapy and being unable to move the legs is making it difficult for discharge home. Patient has already been refused for transfer to [facility]. I reached out to Dr. Oley Balm office [facility] specialist orthopedics at [phone] and left voice message as they were unable to answer. I have encouraged patient to continue to work with therapy and to let me know if he needs any additional pain medication to help with this This option will be to improve with therapy and to go to a rehab or to be able to move about some on some pain medication and then discharge home with outpatient orthopedics follow-up.   Mild rhabdomyolysis -resolved We have discontinued IV fluids   Alcohol abuse Continue thiamine Continue CIWA protocol as well as seizure precaution   Polysubstance use disorder Urine toxicology obtain last month was positive for amphetamine and benzodiazepine Patient has been counseled on cessation   Tobacco abuse Continue nicotine patch   GERD Continue PPI therapy   Hypokalemia-improved Continue repletion and monitoring   Hyponatremia likely hypovolemic - improved  Monitor sodium levels closely IV fluid discontinued   Maculopapular rash, generalized Resolved.        DVT prophylaxis: lovenox    Current Admission Status: Inpatient   Patient is not a safe discharge as he  continues to require several pain medications needing control before discharge       Family Communication: none at this time      Physical Exam Constitutional: Young male laying in bed in no acute distress Cardiovascular:     Rate and Rhythm: Normal rate and rhythm Pulmonary: Clear to auscultation bilaterally Abdominal:     General: Abdomen is flat. Bowel sounds are normal.     Palpations: Abdomen is soft.  Musculoskeletal:     Right lower leg: No edema.     Left lower leg: No edema.  Skin:    General: Skin is warm and dry.  Neurological:     General: No focal deficit present.     Mental Status: He is alert and oriented to person, place, and time.  Psychiatric:        Behavior: Behavior normal.       Data Reviewed: I have reviewed patient's labs including CBC, BMP, nursing documentation, [facility] manager documentation as well as vitals      Vitals:   01/22/23 0122 01/22/23 0756 01/22/23 1352 01/22/23 1605  BP: 114/70 115/76  119/73  Pulse: 82 82  86  Resp: 16 18 18 16   Temp: 97.7 F (36.5 C) 98.3 F (36.8  C)  98 F (36.7 C)  TempSrc:      SpO2: 96% 97%  98%  Weight:      Height:         Author: Loyce Dys, MD 01/22/2023 4:09 PM  For on call review [email or web address].

## 2023-01-23 DIAGNOSIS — M79605 Pain in left leg: Secondary | ICD-10-CM | POA: Diagnosis not present

## 2023-01-23 MED ORDER — NICOTINE POLACRILEX 2 MG MT GUM
2.0000 mg | CHEWING_GUM | OROMUCOSAL | Status: DC | PRN
  Administered 2023-01-23 – 2023-01-24 (×2): 2 mg via ORAL
  Filled 2023-01-23 (×2): qty 1

## 2023-01-23 MED ORDER — ORAL CARE MOUTH RINSE: 15.0000 mL | OROMUCOSAL | Status: DC | PRN

## 2023-01-23 NOTE — Progress Notes (Signed)
Progress Note   Patient: [name] UVO:[id number] DOB: 01/28/1991 DOA: 01/08/2023     15 DOS: the patient was seen and examined on 01/23/2023     Subjective: Patient seen and examined at bedside this morning Patient able to work with physical therapist today and was able to ambulate with rolling walker for about 200 feet. I have discussed discharge home with him with outpatient orthopedics follow-up and he is in agreement. Unfortunately due to his insurance he is unable to have home health according to [facility]. Patient is being planned for discharge tomorrow with his mum.  He tells me that the mom needs to get ready to receive him tomorrow.  He does not feel today is a good day for discharge.  Brief Narrative / Hospital Course:  [name] is a 32 y.o. male with medical history significant for left hip pain so severe that he can't lift his left leg.  Patient does have a history of drug abuse and alcohol abuse. He has a history of undergoing ORIF of the left acetabulum over 5 years ago at [facility].  Of note, was recently discharged on July 2 after having pneumomediastinum which was evaluated with CT chest and then an upper GI barium swallow which was negative for rupture or perforation, patient had acute kidney injury anion gap metabolic acidosis alcohol abuse altered mental status leukocytosis thrombocytosis attributed to reactive nature and polysubstance abuse.   07/22: Presented to the emergency room with complaint of left hip pain.  Admitted to hospitalist service. DVT scan was negative. 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  74/25: Seen by orthopedics, CT of the hip, patient was initially refusing CT but did end up getting it done. 07/24: Per Dr. Allena Katz, orthopedics, patient will require likely complicated left hip arthroplasty with orthopedic subspecialist possibly at [facility] or [facility].   Pain control remains a challenge. Somnolent w/ dilaudid. Scheduled oxycodone, APAP, naproxen 07/25: reduced dilaudid dose/frequency.  Increased po oxycodone dose. Goal to correct his underlying source of pain - given social issues and pain control challenge, he is not safe at this time for outpatient discharge and SNF will not take him if homeless. Ortho on call at [facility] not aware of anyone there who can perform the necessary surgery. Will reach out to [facility] and [facility] to try to transfer or otherwise arrange appropriate follow-up 07/26: long discussion w/ patient and his brother today - reviewed plan for ideally transfer but this is up to [facility], backup plan for taper off IV opiates and discharge to home/SNF to follow outpatient. At this point, he cannot ambulate  07/27: [facility] unable to take him.  07/28: Will reach out to [facility] and [facility] Monday to see about arranging outpatient follow-up. Working on tapering off IV pain control, have consulted pharmacy to confirm MME and safe transition to fentanyl patches. Pt is aware this is temporary measure until he can get hip repaired, at which point anticipate tapering off / following up with pain management and possibly w/ addiction recovery care.  07/29: called [facility], waiting to hear back. Currently on fentanyl patch 25 mcg, still using oxydocone 15 mg po q4h prn, OFF dilaudid, added back Naproxen today  7/30-8/5: Patient was continued on pain medication, still continued to refuse physical therapy although I offered to give additional pain relief prior to physical therapy, I reached out to Dr. Oley Balm office [facility]  specialist orthopedics at [phone] and left voice message as they were unable to answer. I have encouraged patient to continue to work with therapy and to let me know if he needs any additional pain medication to help with this 8/6 -I spoke with [facility] outpatient orthopedic scheduling at [phone].  And they requested for patient to call this number and  they will give him a follow-up appointment to verify this information from him.       ASSESSMENT & PLAN:   Principal Problem:   Acute leg pain, left Active Problems:   Rhabdomyolysis   Maculopapular rash, generalized   Hyponatremia   Hypokalemia   Heartburn   Elevated CK   Tobacco abuse   Alcohol abuse   Left hip pain   Acute left hip pain likely secondary to severe osteoarthritis in the setting of fractured hardware Severe degenerative changes of the left hip joint with remodeling of the acetabulum and femoral head x-ray of the hip showed moderate to severe posttraumatic degenerative change involving the left hip and one of the screws involving the medial plate is fractured also slight interval increase in the displacement of the fragment of the left fractured screw I have continue to encourage patient to work with physical therapy Continue current pain management MRI negative for effusion, infection seems unlikely Patient has had long hospital stay due to a lot of social issues and acute pain however improving now able to ambulate with PT today Patient was refused for inpatient to inpatient transfer to [facility]. I reached out to Dr. Oley Balm office [facility] specialist orthopedics at [phone] and left voice message as they were unable to answer to see if I could make a follow-up appointment. Patient able to work with physical therapist today and was able to ambulate with rolling walker for about 200 feet. I have discussed discharge home with him with outpatient orthopedics follow-up and he is in agreement. Unfortunately due to his insurance he is unable to have home health according to [facility].   Patient plans to have a place arranged at her mom's house for him to go tomorrow.  I informed him he will need outpatient pain physician as we will not be able to give him lots of opioid prescription. I spoke with [facility] outpatient orthopedic scheduling at [phone].  And they requested  for patient to call this number and they will give him a follow-up appointment after day verify some information from him.      Mild rhabdomyolysis -resolved We have discontinued IV fluids   Alcohol abuse Continue thiamine Continue CIWA protocol as well as seizure precaution   Polysubstance use disorder Urine toxicology obtain last month was positive for amphetamine and benzodiazepine Patient has been counseled on cessation   Tobacco abuse Continue nicotine patch   GERD Continue PPI therapy   Hypokalemia-improved Continue repletion and monitoring   Hyponatremia likely hypovolemic - improved  Monitor sodium levels closely IV fluid discontinued   Maculopapular rash, generalized Resolved.        DVT prophylaxis: lovenox    Current Admission Status: Inpatient   Disposition: Home with family with outpatient orthopedics follow-up     Family Communication: I called patient's mom today and left a voice message concerning plan discharge tomorrow     Physical Exam Constitutional: Young male laying in bed in no acute distress Cardiovascular:     Rate and Rhythm: Normal rate and rhythm Pulmonary: Clear to auscultation bilaterally Abdominal:     General: Abdomen is flat. Bowel  sounds are normal.     Palpations: Abdomen is soft.  Musculoskeletal:     Right lower leg: No edema.     Left lower leg: No edema.  Skin:    General: Skin is warm and dry.  Neurological:     General: No focal deficit present.     Mental Status: He is alert and oriented to person, place, and time.  Psychiatric:        Behavior: Behavior normal.       Data Reviewed: I have reviewed the patient's CBC, BMP, transition of care documentation, nursing documentation PT OT documentation as well as patient's vitals    Vitals:   01/22/23 1352 01/22/23 1605 01/22/23 2334 01/23/23 0739  BP:  119/73 113/73 121/75  Pulse:  86 92 84  Resp: 18 16 16 16   Temp:  98 F (36.7 C) 98.3 F (36.8 C) 97.9 F  (36.6 C)  TempSrc:      SpO2:  98% 97% 96%  Weight:      Height:         Author: Loyce Dys, MD 01/23/2023 12:54 PM  For on call review [email or web address].

## 2023-01-23 NOTE — TOC Progression Note (Signed)
Transition of Care [facility]) - Progression Note    Patient Details  Name: [name] MRN: [id number] Date of Birth: 06/10/1991  Transition of Care [facility]) CM/SW Contact  Marlowe Sax, RN Phone Number: 01/23/2023, 3:06 PM  Clinical Narrative:    Sherron Monday with Vaughan Basta with [employer], he will deliver a rolling walker to the patient prior to DC   Expected Discharge Plan: Home/Self Care Barriers to Discharge: Continued Medical Work up  Expected Discharge Plan and Services   Discharge Planning Services: CM Consult   Living arrangements for the past 2 months: Single Family Home                                       Social Determinants of Health (SDOH) Interventions SDOH Screenings   Food Insecurity: No Food Insecurity (01/09/2023)  Housing: Medium Risk (12/17/2022)  Transportation Needs: Unmet Transportation Needs (12/17/2022)  Utilities: Not At Risk (12/17/2022)  Tobacco Use: High Risk (01/08/2023)    Readmission Risk Interventions     No data to display

## 2023-01-23 NOTE — Evaluation (Signed)
Occupational Therapy Evaluation Patient Details Name: [name] MRN: [id number] DOB: 08/07/1990 Today's Date: 01/23/2023   History of Present Illness [name] is a 32 y.o. male with a history of drug use and ORIF left acetabulum over 5 years ago at [facility], now presenting with severe left hip degenerative changes. Radiographs today vs 2020 show possible change in screw position, currently in a possible intraarticular position.   Clinical Impression   Upon entering the room, pt supine in bed and agreeable to OT intervention. Pt reports he has plans to stay at his mother's home at discharge. Pt is Ind with self care and mobility at baseline. Pt is motivated and "ready to get out of this bed" during evaluation. He is very cooperative and pleasant overall and motivated for therapeutic intervention. Pt needing assistance to don L sock. He performs bed mobility without assistance and stands with supervision from EOB. No weight bearing precautions at this time but pt appears to be utilizing PWB on L LE for mobility and ambulates 200' with RW with supervision overall. NT in room changing linens and pt remains standing as he declines to sit in recliner chair. He does endorse 8/10 pain with mobility in L hip. Pt returning to bed at end of session with call bell and all needs within reach. Pt does not need skilled OT intervention at this time.      Recommendations for follow up therapy are one component of a multi-disciplinary discharge planning process, led by the attending physician.  Recommendations may be updated based on patient status, additional functional criteria and insurance authorization.   Assistance Recommended at Discharge Intermittent Supervision/Assistance  Patient can return home with the following Help with stairs or ramp for entrance;A little help with bathing/dressing/bathroom;Assist for transportation       Equipment Recommendations  BSC/3in1;Other (comment) (RW)       Precautions  / Restrictions Precautions Precautions: Fall Restrictions Weight Bearing Restrictions: No      Mobility Bed Mobility Overal bed mobility: Modified Independent             General bed mobility comments: no physical assistance from flat bed    Transfers Overall transfer level: Needs assistance Equipment used: Rolling walker (2 wheels) Transfers: Sit to/from Stand Sit to Stand: Supervision                  Balance Overall balance assessment: Needs assistance Sitting-balance support: Feet supported Sitting balance-Leahy Scale: Good     Standing balance support: Reliant on assistive device for balance, During functional activity, Bilateral upper extremity supported Standing balance-Leahy Scale: Good                             ADL either performed or assessed with clinical judgement   ADL Overall ADL's : Needs assistance/impaired                                       General ADL Comments: Pt needing assistance to don sock on L LE but able to don on R and simulated transfers with supervision and use of RW.     Vision Patient Visual Report: No change from baseline              Pertinent Vitals/Pain Pain Assessment Pain Assessment: 0-10 Pain Score: 8  Pain Location: L hip with mobility Pain Descriptors /  Indicators: Constant, Discomfort Pain Intervention(s): Premedicated before session, Repositioned, Monitored during session     Hand Dominance Right   Extremity/Trunk Assessment Upper Extremity Assessment Upper Extremity Assessment: Overall WFL for tasks assessed   Lower Extremity Assessment Lower Extremity Assessment: Generalized weakness       Communication Communication Communication: No difficulties   Cognition Arousal/Alertness: Awake/alert Behavior During Therapy: WFL for tasks assessed/performed Overall Cognitive Status: Within Functional Limits for tasks assessed                                  General Comments: Pt is pleasant and cooperative                Home Living   Living Arrangements: Parent   Type of Home: House Home Access: Stairs to enter Secretary/administrator of Steps: 5 Entrance Stairs-Rails: Left Home Layout: One level                   Additional Comments: pt reports his mother lives in [place], [place]      Prior Functioning/Environment Prior Level of Function : Independent/Modified Independent                                 OT Goals(Current goals can be found in the care plan section) Acute Rehab OT Goals Patient Stated Goal: to decrease pain and move better OT Goal Formulation: With patient Time For Goal Achievement: 01/23/23 Potential to Achieve Goals: Good  OT Frequency:         AM-PAC OT "6 Clicks" Daily Activity     Outcome Measure Help from another person eating meals?: None Help from another person taking care of personal grooming?: None Help from another person toileting, which includes using toliet, bedpan, or urinal?: None Help from another person bathing (including washing, rinsing, drying)?: A Little Help from another person to put on and taking off regular upper body clothing?: None Help from another person to put on and taking off regular lower body clothing?: A Little 6 Click Score: 22   End of Session Equipment Utilized During Treatment: Rolling walker (2 wheels) Nurse Communication: Mobility status  Activity Tolerance: Patient tolerated treatment well Patient left: in bed;with call bell/phone within reach                   Time: 1308-6578 OT Time Calculation (min): 16 min Charges:  OT General Charges $OT Visit: 1 Visit OT Evaluation $OT Eval Low Complexity: 1 Low OT Treatments $Therapeutic Activity: 8-22 mins  Jackquline Denmark, MS, OTR/L , CBIS ascom [phone]  01/23/23, 1:18 PM

## 2023-01-23 NOTE — Plan of Care (Signed)

## 2023-01-23 NOTE — Evaluation (Signed)
Physical Therapy Evaluation Patient Details Name: ESPER TRUELOCK MRN: [id number] DOB: Oct 08, 1990 Today's Date: 01/23/2023  History of Present Illness  Pt is a 32 y.o. male with a history of drug use and ORIF L acetabulum over 5 years ago at [facility], now presenting with severe L hip degenerative changes. Radiographs today vs 2020 show possible change in screw position, currently in a possible intraarticular position.  Clinical Impression   Pt is seen for RE this date. Pt is received in bed following PRN pain medication, he is agreeable to PT session. Pt requires supervision with bed mobility, transfers, amb, and stair navigation. Pt able to amb approx 250' with RW with supervision for safety. Pt demonstrates decreased stride length and PWB on LLE throughout amb with min LLE pain/discomfort. Pt able to navigate 4 steps using L railing and cane on R UE. Pt requires cuing/education for stair navigation to reduce LLE pain and weight bearing. He reports more discomfort with descending than ascending stating "it's from bending my leg". Pt add'l reports ability to "put 80% of weight on left leg in standing and walking with walker". Pt would benefit from skilled PT to address above deficits and promote optimal return to PLOF.       If plan is discharge home, recommend the following: A little help with walking and/or transfers;A little help with bathing/dressing/bathroom;Assistance with cooking/housework;Assist for transportation;Help with stairs or ramp for entrance   Can travel by private vehicle        Equipment Recommendations Rolling walker (2 wheels)  Recommendations for Other Services       Functional Status Assessment Patient has had a recent decline in their functional status and demonstrates the ability to make significant improvements in function in a reasonable and predictable amount of time.     Precautions / Restrictions Precautions Precautions: Fall Restrictions Weight Bearing  Restrictions: No      Mobility  Bed Mobility Overal bed mobility: Modified Independent, Independent             General bed mobility comments: No assistance required    Transfers Overall transfer level: Needs assistance Equipment used: Rolling walker (2 wheels) Transfers: Sit to/from Stand Sit to Stand: Supervision           General transfer comment: LLE extension during STS due to L hip pain/discomfort    Ambulation/Gait Ambulation/Gait assistance: Supervision Gait Distance (Feet): 250 Feet Assistive device: Rolling walker (2 wheels) Gait Pattern/deviations: Step-through pattern, Decreased stride length Gait velocity: WNL     General Gait Details: requires RW for amb, PWB on LLE with "80% of weight on LLE" during standing and amb  Stairs Stairs: Yes Stairs assistance: Supervision, Modified independent (Device/Increase time) Stair Management: One rail Left, Step to pattern, With cane Number of Stairs: 4 General stair comments: 1 UE support on railing, cane on R side ascending/L-side descending, step-to pattern with supervision  Wheelchair Mobility     Tilt Bed    Modified Rankin (Stroke Patients Only)       Balance Overall balance assessment: Needs assistance Sitting-balance support: Feet supported Sitting balance-Leahy Scale: Good     Standing balance support: Reliant on assistive device for balance, During functional activity, Bilateral upper extremity supported Standing balance-Leahy Scale: Good                               Pertinent Vitals/Pain Pain Assessment Pain Assessment: Faces Faces Pain Scale: Hurts a little bit  Pain Location: L hip with mobility Pain Descriptors / Indicators: Constant, Discomfort Pain Intervention(s): Monitored during session, Premedicated before session, Repositioned    Home Living Family/patient expects to be discharged to:: Private residence Living Arrangements: Parent Available Help at  Discharge: Family Type of Home: House Home Access: Stairs to enter Entrance Stairs-Rails: Left Entrance Stairs-Number of Steps: 5   Home Layout: One level Home Equipment: Cane - single point Additional Comments: Pt reports he will be discharged to his mother's home tomorrow (01/24/23)    Prior Function Prior Level of Function : Independent/Modified Independent             Mobility Comments: Pt states "I use a cane to walk but will need a walker for now".       Hand Dominance   Dominant Hand: Right    Extremity/Trunk Assessment   Upper Extremity Assessment Upper Extremity Assessment: Defer to OT evaluation    Lower Extremity Assessment Lower Extremity Assessment: Overall WFL for tasks assessed RLE Deficits / Details: RLE overall WFL for tasks assessed LLE: Unable to fully assess due to pain       Communication   Communication: No difficulties  Cognition Arousal/Alertness: Awake/alert Behavior During Therapy: WFL for tasks assessed/performed Overall Cognitive Status: Within Functional Limits for tasks assessed                                 General Comments: Pt is pleasant and cooperative        General Comments      Exercises     Assessment/Plan    PT Assessment Patient needs continued PT services  PT Problem List Decreased strength;Decreased range of motion;Decreased activity tolerance;Decreased balance;Decreased mobility;Pain;Decreased knowledge of use of DME;Decreased safety awareness       PT Treatment Interventions DME instruction;Therapeutic exercise;Gait training;Balance training;Stair training;Functional mobility training;Therapeutic activities;Patient/family education    PT Goals (Current goals can be found in the Care Plan section)  Acute Rehab PT Goals Patient Stated Goal: get better PT Goal Formulation: With patient Time For Goal Achievement: 01/25/23 Potential to Achieve Goals: Good    Frequency Min 1X/week      Co-evaluation               AM-PAC PT "6 Clicks" Mobility  Outcome Measure Help needed turning from your back to your side while in a flat bed without using bedrails?: None Help needed moving from lying on your back to sitting on the side of a flat bed without using bedrails?: None Help needed moving to and from a bed to a chair (including a wheelchair)?: None Help needed standing up from a chair using your arms (e.g., wheelchair or bedside chair)?: A Little Help needed to walk in hospital room?: A Little Help needed climbing 3-5 steps with a railing? : A Little 6 Click Score: 21    End of Session Equipment Utilized During Treatment: Gait belt Activity Tolerance: Patient tolerated treatment well Patient left: in chair;with call bell/phone within reach Nurse Communication: Mobility status PT Visit Diagnosis: Difficulty in walking, not elsewhere classified (R26.2);Other abnormalities of gait and mobility (R26.89);Muscle weakness (generalized) (M62.81)    Time: 4098-1191 PT Time Calculation (min) (ACUTE ONLY): 13 min   Charges:                 Elmon Else, SPT   Lucilia Yanni 01/23/2023, 4:33 PM

## 2023-01-23 NOTE — Plan of Care (Signed)

## 2023-01-24 ENCOUNTER — Encounter: Payer: Self-pay | Admitting: Internal Medicine

## 2023-01-24 MED ORDER — NALOXONE HCL 4 MG/0.1ML NA LIQD: NASAL | 1 refills | Status: AC

## 2023-01-24 MED ORDER — PANTOPRAZOLE SODIUM 40 MG PO TBEC: 40.0000 mg | DELAYED_RELEASE_TABLET | Freq: Two times a day (BID) | ORAL | 1 refills | Status: AC

## 2023-01-24 MED ORDER — OXYCODONE HCL 15 MG PO TABS: 15.0000 mg | ORAL_TABLET | Freq: Four times a day (QID) | ORAL | 0 refills | Status: DC | PRN

## 2023-01-24 MED ORDER — SENNOSIDES-DOCUSATE SODIUM 8.6-50 MG PO TABS: 2.0000 | ORAL_TABLET | Freq: Every day | ORAL | 1 refills | Status: AC

## 2023-01-24 MED ORDER — NAPROXEN 500 MG PO TABS: 500.0000 mg | ORAL_TABLET | Freq: Two times a day (BID) | ORAL | 1 refills | Status: DC

## 2023-01-24 MED ORDER — FENTANYL 25 MCG/HR TD PT72: 1.0000 | MEDICATED_PATCH | TRANSDERMAL | 0 refills | Status: DC

## 2023-01-24 MED ORDER — POLYETHYLENE GLYCOL 3350 17 G PO PACK: 17.0000 g | PACK | Freq: Every day | ORAL | 1 refills | Status: AC

## 2023-01-24 NOTE — Progress Notes (Signed)
   01/24/23 1200  Spiritual Encounters  Type of Visit Initial  Care provided to: Patient  Referral source Chaplain assessment  Reason for visit Routine spiritual support  OnCall Visit No  Interventions  Spiritual Care Interventions Made Established relationship of care and support;Compassionate presence;Reflective listening;Prayer  Intervention Outcomes  Outcomes Connection to spiritual care;Awareness of support  Spiritual Care Plan  Spiritual Care Issues Still Outstanding No further spiritual care needs at this time (see row info)   Chaplain spent time with patient at bedside providing compassionate care and prayer.  Patient has concerns of needing a referral placed with [facility] for follow up care with his hip. Chaplain confirmed with nurse the referral is being cared for. Chaplain spiritual support services available as the need arises.

## 2023-01-24 NOTE — Progress Notes (Signed)
Mobility Specialist - Progress Note   01/24/23 1036  Mobility  Activity Stood at bedside;Ambulated independently in room  Level of Assistance Modified independent, requires aide device or extra time  Assistive Device Front wheel walker  Distance Ambulated (ft) 24 ft  Activity Response Tolerated well  $Mobility charge 1 Mobility  Mobility Specialist Start Time (ACUTE ONLY) 1025  Mobility Specialist Stop Time (ACUTE ONLY) 1033  Mobility Specialist Time Calculation (min) (ACUTE ONLY) 8 min   Pt supine upon entry, utilizing RA. Pt expresses pain in LLE, agreeable to OOB amb within the room this date. Pt completed bed mob indep, STS to RW ModI-- utilizing the RW by crossing LUE to the right handle of the RW, shifting weight and using momentum to stand. Once standing and taking a few steps Pt pain in LLE became greater. During amb to the door, Pt expressed that PWB on the LLE is due to that "leg being shorter than the right" and to "minimize pain of that hip". Pt returned to bed, left supine with needs within reach.  Zetta Bills Mobility Specialist 01/24/23 10:44 AM

## 2023-01-24 NOTE — Discharge Summary (Signed)
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 81.0 82.1 82.8 81.4 83.7  PLT 565* 552* 558* 555* 516*   Basic Metabolic Panel: Recent Labs  Lab 01/20/23 0403 01/21/23 0551 01/22/23 0512 01/23/23 0405 01/24/23 0439  NA 133* 138 136 135 137  K 3.9 3.9 3.9 3.8 4.1  CL 100 104 102 103 103  CO2 26 25 25 25 28   GLUCOSE 86 109* 94 118* 95  BUN 29* 33* 31* 32* 30*  CREATININE 0.73 0.58* 0.64 0.71 0.76  CALCIUM 8.6* 8.8* 8.8* 8.5* 8.8*   Liver Function Tests: No results for input(s): "AST", "ALT", "ALKPHOS", "BILITOT", "PROT", "ALBUMIN" in the last 168 hours. CBG: No results for input(s): "GLUCAP" in the last 168 hours.  Discharge time spent: greater than 30 minutes.  Signed: Pennie Banter, DO [facility] 01/24/2023

## 2023-01-24 NOTE — Plan of Care (Signed)

## 2023-01-30 ENCOUNTER — Other Ambulatory Visit (HOSPITAL_COMMUNITY): Payer: Self-pay

## 2023-01-30 ENCOUNTER — Telehealth (HOSPITAL_COMMUNITY): Payer: Self-pay | Admitting: Pharmacy Technician

## 2023-01-30 NOTE — Telephone Encounter (Signed)
Pharmacy Patient Advocate Encounter   Received notification from Fax that prior authorization for fentaNYL 25MCG/HR 72 hr patches is required/requested.   Insurance verification completed.   The patient is insured through [facility] .   Per test claim: PA required; PA submitted to [employer] via CoverMyMeds Key/confirmation #/EOC [id number] Status is pending

## 2023-01-31 ENCOUNTER — Other Ambulatory Visit (HOSPITAL_COMMUNITY): Payer: Self-pay

## 2023-01-31 NOTE — Telephone Encounter (Signed)
Pharmacy Patient Advocate Encounter  Received notification from [facility] that Prior Authorization for fentaNYL 25MCG/HR 72 hr patches  has been APPROVED from 01/30/2023 to 05/01/2023   PA #/Case ID/Reference #: [id number]

## 2023-03-26 ENCOUNTER — Other Ambulatory Visit: Payer: Self-pay | Admitting: Pain Medicine

## 2023-09-24 DIAGNOSIS — F1110 Opioid abuse, uncomplicated: Secondary | ICD-10-CM | POA: Diagnosis present

## 2023-09-24 DIAGNOSIS — F1190 Opioid use, unspecified, uncomplicated: Secondary | ICD-10-CM | POA: Diagnosis present

## 2023-10-11 DIAGNOSIS — B182 Chronic viral hepatitis C: Secondary | ICD-10-CM | POA: Insufficient documentation

## 2023-11-06 ENCOUNTER — Other Ambulatory Visit: Payer: Self-pay

## 2023-11-06 ENCOUNTER — Encounter: Payer: Self-pay | Admitting: Emergency Medicine

## 2023-11-06 ENCOUNTER — Emergency Department: Payer: MEDICAID

## 2023-11-06 ENCOUNTER — Inpatient Hospital Stay
Admission: EM | Admit: 2023-11-06 | Discharge: 2023-11-08 | DRG: 917 | Disposition: A | Discharge status: Home / Self Care | Payer: MEDICAID | Attending: Obstetrics and Gynecology | Admitting: Obstetrics and Gynecology

## 2023-11-06 DIAGNOSIS — Z792 Long term (current) use of antibiotics: Secondary | ICD-10-CM

## 2023-11-06 DIAGNOSIS — K219 Gastro-esophageal reflux disease without esophagitis: Secondary | ICD-10-CM | POA: Diagnosis not present

## 2023-11-06 DIAGNOSIS — Z8619 Personal history of other infectious and parasitic diseases: Secondary | ICD-10-CM

## 2023-11-06 DIAGNOSIS — Z8249 Family history of ischemic heart disease and other diseases of the circulatory system: Secondary | ICD-10-CM

## 2023-11-06 DIAGNOSIS — Z9049 Acquired absence of other specified parts of digestive tract: Secondary | ICD-10-CM

## 2023-11-06 DIAGNOSIS — N179 Acute kidney failure, unspecified: Secondary | ICD-10-CM | POA: Diagnosis present

## 2023-11-06 DIAGNOSIS — R4182 Altered mental status, unspecified: Principal | ICD-10-CM | POA: Diagnosis present

## 2023-11-06 DIAGNOSIS — F1110 Opioid abuse, uncomplicated: Secondary | ICD-10-CM | POA: Diagnosis present

## 2023-11-06 DIAGNOSIS — G928 Other toxic encephalopathy: Principal | ICD-10-CM | POA: Diagnosis present

## 2023-11-06 DIAGNOSIS — Y831 Surgical operation with implant of artificial internal device as the cause of abnormal reaction of the patient, or of later complication, without mention of misadventure at the time of the procedure: Secondary | ICD-10-CM | POA: Diagnosis present

## 2023-11-06 DIAGNOSIS — T8452XA Infection and inflammatory reaction due to internal left hip prosthesis, initial encounter: Secondary | ICD-10-CM | POA: Diagnosis present

## 2023-11-06 DIAGNOSIS — B9562 Methicillin resistant Staphylococcus aureus infection as the cause of diseases classified elsewhere: Secondary | ICD-10-CM | POA: Diagnosis present

## 2023-11-06 DIAGNOSIS — F1510 Other stimulant abuse, uncomplicated: Secondary | ICD-10-CM | POA: Diagnosis present

## 2023-11-06 DIAGNOSIS — Z5982 Transportation insecurity: Secondary | ICD-10-CM

## 2023-11-06 DIAGNOSIS — Z79899 Other long term (current) drug therapy: Secondary | ICD-10-CM

## 2023-11-06 DIAGNOSIS — Z720 Tobacco use: Secondary | ICD-10-CM | POA: Diagnosis present

## 2023-11-06 DIAGNOSIS — G629 Polyneuropathy, unspecified: Secondary | ICD-10-CM | POA: Diagnosis present

## 2023-11-06 DIAGNOSIS — T403X1A Poisoning by methadone, accidental (unintentional), initial encounter: Principal | ICD-10-CM | POA: Diagnosis present

## 2023-11-06 DIAGNOSIS — G9340 Encephalopathy, unspecified: Secondary | ICD-10-CM | POA: Diagnosis not present

## 2023-11-06 DIAGNOSIS — F1190 Opioid use, unspecified, uncomplicated: Secondary | ICD-10-CM | POA: Diagnosis present

## 2023-11-06 DIAGNOSIS — Z5986 Financial insecurity: Secondary | ICD-10-CM

## 2023-11-06 DIAGNOSIS — E860 Dehydration: Secondary | ICD-10-CM | POA: Diagnosis present

## 2023-11-06 DIAGNOSIS — Z7982 Long term (current) use of aspirin: Secondary | ICD-10-CM

## 2023-11-06 DIAGNOSIS — F17210 Nicotine dependence, cigarettes, uncomplicated: Secondary | ICD-10-CM | POA: Diagnosis present

## 2023-11-06 DIAGNOSIS — K5900 Constipation, unspecified: Secondary | ICD-10-CM | POA: Diagnosis present

## 2023-11-06 DIAGNOSIS — F1010 Alcohol abuse, uncomplicated: Secondary | ICD-10-CM | POA: Diagnosis present

## 2023-11-06 LAB — URINALYSIS, ROUTINE W REFLEX MICROSCOPIC
Bilirubin Urine: NEGATIVE
Glucose, UA: NEGATIVE mg/dL
Hgb urine dipstick: NEGATIVE
Ketones, ur: NEGATIVE mg/dL
Leukocytes,Ua: NEGATIVE
Nitrite: NEGATIVE
Protein, ur: NEGATIVE mg/dL
Specific Gravity, Urine: 1.021 (ref 1.005–1.030)
pH: 5 (ref 5.0–8.0)

## 2023-11-06 LAB — CBC WITH DIFFERENTIAL/PLATELET
Abs Immature Granulocytes: 0.04 10*3/uL (ref 0.00–0.07)
Basophils Absolute: 0.1 10*3/uL (ref 0.0–0.1)
Basophils Relative: 1 %
Eosinophils Absolute: 0.2 10*3/uL (ref 0.0–0.5)
Eosinophils Relative: 2 %
HCT: 34.1 % — ABNORMAL LOW (ref 39.0–52.0)
Hemoglobin: 10.7 g/dL — ABNORMAL LOW (ref 13.0–17.0)
Immature Granulocytes: 1 %
Lymphocytes Relative: 21 %
Lymphs Abs: 1.7 10*3/uL (ref 0.7–4.0)
MCH: 23.7 pg — ABNORMAL LOW (ref 26.0–34.0)
MCHC: 31.4 g/dL (ref 30.0–36.0)
MCV: 75.6 fL — ABNORMAL LOW (ref 80.0–100.0)
Monocytes Absolute: 0.6 10*3/uL (ref 0.1–1.0)
Monocytes Relative: 8 %
Neutro Abs: 5.5 10*3/uL (ref 1.7–7.7)
Neutrophils Relative %: 67 %
Platelets: 285 10*3/uL (ref 150–400)
RBC: 4.51 MIL/uL (ref 4.22–5.81)
RDW: 15.6 % — ABNORMAL HIGH (ref 11.5–15.5)
WBC: 8 10*3/uL (ref 4.0–10.5)
nRBC: 0 % (ref 0.0–0.2)

## 2023-11-06 LAB — URINE DRUG SCREEN, QUALITATIVE (ARMC ONLY)
Amphetamines, Ur Screen: NOT DETECTED
Barbiturates, Ur Screen: NOT DETECTED
Benzodiazepine, Ur Scrn: NOT DETECTED
Cannabinoid 50 Ng, Ur ~~LOC~~: POSITIVE — AB
Cocaine Metabolite,Ur ~~LOC~~: NOT DETECTED
MDMA (Ecstasy)Ur Screen: NOT DETECTED
Methadone Scn, Ur: POSITIVE — AB
Opiate, Ur Screen: NOT DETECTED
Phencyclidine (PCP) Ur S: NOT DETECTED
Tricyclic, Ur Screen: NOT DETECTED

## 2023-11-06 LAB — COMPREHENSIVE METABOLIC PANEL WITH GFR
ALT: 33 U/L (ref 0–44)
AST: 42 U/L — ABNORMAL HIGH (ref 15–41)
Albumin: 4.1 g/dL (ref 3.5–5.0)
Alkaline Phosphatase: 75 U/L (ref 38–126)
Anion gap: 11 (ref 5–15)
BUN: 42 mg/dL — ABNORMAL HIGH (ref 6–20)
CO2: 24 mmol/L (ref 22–32)
Calcium: 9.6 mg/dL (ref 8.9–10.3)
Chloride: 106 mmol/L (ref 98–111)
Creatinine, Ser: 1.71 mg/dL — ABNORMAL HIGH (ref 0.61–1.24)
GFR, Estimated: 53 mL/min — ABNORMAL LOW (ref >60)
Glucose, Bld: 102 mg/dL — ABNORMAL HIGH (ref 70–99)
Potassium: 4.7 mmol/L (ref 3.5–5.1)
Sodium: 141 mmol/L (ref 135–145)
Total Bilirubin: 0.6 mg/dL (ref 0.0–1.2)
Total Protein: 8.5 g/dL — ABNORMAL HIGH (ref 6.5–8.1)

## 2023-11-06 LAB — ETHANOL: Alcohol, Ethyl (B): <15 mg/dL (ref <15)

## 2023-11-06 LAB — AMMONIA: Ammonia: 32 umol/L (ref 9–35)

## 2023-11-06 MED ORDER — ENOXAPARIN SODIUM 40 MG/0.4ML IJ SOSY
40.0000 mg | PREFILLED_SYRINGE | INTRAMUSCULAR | Status: DC
  Administered 2023-11-07 (×2): 40 mg via SUBCUTANEOUS
  Filled 2023-11-06 (×2): qty 0.4

## 2023-11-06 MED ORDER — SODIUM CHLORIDE 0.9 % IV BOLUS
1000.0000 mL | Freq: Once | INTRAVENOUS | Status: AC
  Administered 2023-11-06: 1000 mL via INTRAVENOUS

## 2023-11-06 MED ORDER — ATROPINE SULFATE 1 MG/10ML IJ SOSY
0.5000 mg | PREFILLED_SYRINGE | Freq: Once | INTRAMUSCULAR | Status: AC
  Administered 2023-11-06: 0.5 mg via INTRAVENOUS

## 2023-11-06 MED ORDER — ONDANSETRON HCL 4 MG PO TABS: 4.0000 mg | ORAL_TABLET | Freq: Four times a day (QID) | ORAL | Status: DC | PRN

## 2023-11-06 MED ORDER — MAGNESIUM HYDROXIDE 400 MG/5ML PO SUSP: 30.0000 mL | Freq: Every day | ORAL | Status: DC | PRN

## 2023-11-06 MED ORDER — SODIUM CHLORIDE 0.9 % IV SOLN: INTRAVENOUS | Status: AC

## 2023-11-06 MED ORDER — SODIUM CHLORIDE 0.9 % IV BOLUS
500.0000 mL | Freq: Once | INTRAVENOUS | Status: AC
  Administered 2023-11-07: 500 mL via INTRAVENOUS

## 2023-11-06 MED ORDER — ATROPINE SULFATE 1 MG/10ML IJ SOSY
PREFILLED_SYRINGE | INTRAMUSCULAR | Status: AC
  Filled 2023-11-06: qty 10

## 2023-11-06 MED ORDER — PANTOPRAZOLE SODIUM 40 MG PO TBEC
40.0000 mg | DELAYED_RELEASE_TABLET | Freq: Two times a day (BID) | ORAL | Status: DC
  Administered 2023-11-07 (×3): 40 mg via ORAL
  Filled 2023-11-06 (×4): qty 1

## 2023-11-06 MED ORDER — TRAZODONE HCL 50 MG PO TABS: 25.0000 mg | ORAL_TABLET | Freq: Every evening | ORAL | Status: DC | PRN

## 2023-11-06 MED ORDER — ACETAMINOPHEN 650 MG RE SUPP: 650.0000 mg | Freq: Four times a day (QID) | RECTAL | Status: DC | PRN

## 2023-11-06 MED ORDER — ONDANSETRON HCL 4 MG/2ML IJ SOLN: 4.0000 mg | Freq: Four times a day (QID) | INTRAMUSCULAR | Status: DC | PRN

## 2023-11-06 MED ORDER — SENNOSIDES-DOCUSATE SODIUM 8.6-50 MG PO TABS
2.0000 | ORAL_TABLET | Freq: Every day | ORAL | Status: DC
  Administered 2023-11-07: 2 via ORAL
  Filled 2023-11-06 (×2): qty 2

## 2023-11-06 MED ORDER — POLYETHYLENE GLYCOL 3350 17 G PO PACK
17.0000 g | PACK | Freq: Every day | ORAL | Status: DC
Start: 2023-11-07 — End: 2023-11-08
  Administered 2023-11-07: 17 g via ORAL
  Filled 2023-11-06 (×2): qty 1

## 2023-11-06 MED ORDER — ACETAMINOPHEN 325 MG PO TABS: 650.0000 mg | ORAL_TABLET | Freq: Four times a day (QID) | ORAL | Status: DC | PRN

## 2023-11-06 MED ORDER — NALOXONE HCL 4 MG/0.1ML NA LIQD: 0.4000 mg | Freq: Once | NASAL | Status: DC | PRN

## 2023-11-06 NOTE — ED Notes (Signed)
 First nurse note: To ED from home, [facility]EM[facility] for generalized weakness X 1 week and AMS X 1 month. Recent hip replacement at [facility] (unsure which side) Ambulatory on scene Takes methadone, has PICC line. Per EMS, appears intoxicated  95% RA, HR 90, 118/88, no temp or CBG

## 2023-11-06 NOTE — ED Triage Notes (Signed)
 Pt reports his friend called EMS to bring him to the hospital because he has been generally weak and intermittently having slurred speech x3 days. Pt has hx of hepatitis C. Pt axox3, and unable to recall month/day of week. Pt is supposed to start antivirals for Hep C.

## 2023-11-06 NOTE — H&P (Addendum)
 [place]   PATIENT NAME: [name]    MR#:  [id number]  DATE OF BIRTH:  1991-05-28  DATE OF ADMISSION:  11/06/2023  PRIMARY CARE PHYSICIAN: Pcp, No   Patient is coming from: Home  REQUESTING/REFERRING PHYSICIAN: Mike Alcon, MD   CHIEF COMPLAINT:   Chief Complaint  Patient presents with  . Weakness    HISTORY OF PRESENT ILLNESS:  [name] is a 33 y.o. Caucasian male with medical history significant for peripheral neuropathy, substance abuse cleaning alcohol, tobacco marijuana and methamphetamine, chronic hepatitis C and GERD, who presented to the emergency room with acute onset of altered mental status with generalized weakness.  He stated that he was not having much energy.  He was having left hip pain.  He denied any chest pain or palpitations.  No cough or wheezing or dyspnea.  No fever or chills.  No headache or dizziness.  She admitted to constipation.  He was fairly somnolent but arousable.  No dysuria, oliguria or hematuria or flank pain.  He admitted to using marijuana yesterday and methamphetamine about a couple months ago.  His urine drug screen was positive for methadone and cannabinoids.  ED Course: When the patient came to the emergency room, vital signs were within normal.  BMP revealed BUN of 42 with a creatinine 1.71 above previous normal levels.  AST was 42 and total protein was 8.5 and LFTs otherwise were within normal.  CBC showed hemoglobin 10.7 hematocrit 34.1 comparable to previous levels.  UA chemically negative.  Urine drug screen was positive for methadone and cannabinoids EKG as reviewed by me : It showed normal sinus rhythm at a rate of 77. Imaging: Left hip x-ray showed postsurgical changes with no acute abnormality.  The patient was given 2 L bolus of IV normal saline.  He will be admitted to an observation medical telemetry bed for further evaluation and management. PAST MEDICAL HISTORY:   Past Medical History:  Diagnosis Date  . AKI  (acute kidney injury) (HCC)    01/2012 resolved  . AMS (altered mental status) 10/01/2021  . Elevated CK 01/29/2012  . Hypokalemia 01/30/2012  . Hyponatremia 01/29/2012  . Maculopapular rash, generalized 01/29/2012  . Rhabdomyolysis 10/01/2021    PAST SURGICAL HISTORY:   Past Surgical History:  Procedure Laterality Date  . APPENDECTOMY  2007   ruptured   . HIP SURGERY      SOCIAL HISTORY:   Social History   Tobacco Use  . Smoking status: Every Day    Current packs/day: 0.50    Average packs/day: 0.5 packs/day for 8.0 years (4.0 ttl pk-yrs)    Types: Cigarettes  . Smokeless tobacco: Current    Types: Chew  Substance Use Topics  . Alcohol use: Yes    Alcohol/week: 1.0 - 2.0 standard drink of alcohol    Types: 1 - 2 Cans of beer per week    FAMILY HISTORY:   Family History  Problem Relation Age of Onset  . Hypertension Father     DRUG ALLERGIES:  No Known Allergies  REVIEW OF SYSTEMS:   ROS As per history of present illness. All pertinent systems were reviewed above. Constitutional, HEENT, cardiovascular, respiratory, GI, GU, musculoskeletal, neuro, psychiatric, endocrine, integumentary and hematologic systems were reviewed and are otherwise negative/unremarkable except for positive findings mentioned above in the HPI.   MEDICATIONS AT HOME:   Prior to Admission medications   Medication Sig Start Date End Date Taking? Authorizing Provider  fentaNYL  (  DURAGESIC ) 25 MCG/HR Place 1 patch onto the skin every 3 (three) days. 01/26/23   Montey Apa, DO  naloxone  (NARCAN ) nasal spray 4 mg/0.1 mL For suspected opioid overdose, spray one device into the nostril, then call 911.  Repeat a second dose into other nostril if not response 2-3 minutes after first dose. 01/24/23   Montey Apa, DO  naproxen  (NAPROSYN ) 500 MG tablet Take 1 tablet (500 mg total) by mouth 2 (two) times daily with a meal. 01/24/23   Montey Apa, DO  oxyCODONE  (ROXICODONE ) 15 MG immediate  release tablet Take 1 tablet (15 mg total) by mouth every 6 (six) hours as needed for breakthrough pain. 01/24/23   Montey Apa, DO  pantoprazole  (PROTONIX ) 40 MG tablet Take 1 tablet (40 mg total) by mouth 2 (two) times daily. 01/24/23   Montey Apa, DO  polyethylene glycol (MIRALAX  / GLYCOLAX ) 17 g packet Take 17 g by mouth daily. 01/25/23   Darus Engels A, DO  senna-docusate (SENOKOT-S) 8.6-50 MG tablet Take 2 tablets by mouth daily. 01/25/23   Montey Apa, DO      VITAL SIGNS:  Blood pressure 108/77, pulse (!) 58, temperature 98.1 F (36.7 C), temperature source Oral, resp. rate 10, height 5\' 11"  (1.803 m), weight 90.7 kg, SpO2 96%.  PHYSICAL EXAMINATION:  Physical Exam  GENERAL:  33 y.o.-year-old Caucasian male patient lying in the bed with no acute distress.  He was somnolent but arousable. EYES: Pupils equal, round, reactive to light and accommodation. No scleral icterus. Extraocular muscles intact.  HEENT: Head atraumatic, normocephalic. Oropharynx and nasopharynx clear.  NECK:  Supple, no jugular venous distention. No thyroid enlargement, no tenderness.  LUNGS: Normal breath sounds bilaterally, no wheezing, rales,rhonchi or crepitation. No use of accessory muscles of respiration.  CARDIOVASCULAR: Regular rate and rhythm, S1, S2 normal. No murmurs, rubs, or gallops.  ABDOMEN: Soft, nondistended, nontender. Bowel sounds present. No organomegaly or mass.  EXTREMITIES: No pedal edema, cyanosis, or clubbing.  NEUROLOGIC: Cranial nerves II through XII are intact. Muscle strength 5/5 in all extremities. Sensation intact. Gait not checked.  PSYCHIATRIC: The patient is alert and oriented x 3.  Normal affect and good eye contact. SKIN: No obvious rash, lesion, or ulcer.   LABORATORY PANEL:   CBC Recent Labs  Lab 11/06/23 1220  WBC 8.0  HGB 10.7*  HCT 34.1*  PLT 285    ------------------------------------------------------------------------------------------------------------------  Chemistries  Recent Labs  Lab 11/06/23 1220  NA 141  K 4.7  CL 106  CO2 24  GLUCOSE 102*  BUN 42*  CREATININE 1.71*  CALCIUM 9.6  AST 42*  ALT 33  ALKPHOS 75  BILITOT 0.6   ------------------------------------------------------------------------------------------------------------------  Cardiac Enzymes No results for input(s): "TROPONINI" in the last 168 hours. ------------------------------------------------------------------------------------------------------------------  RADIOLOGY:  DG Hip Unilat With Pelvis 2-3 Views Left Result Date: 11/06/2023 CLINICAL DATA:  Left hip pain. EXAM: DG HIP (WITH OR WITHOUT PELVIS) 2-3V LEFT COMPARISON:  January 08, 2023. FINDINGS: Status post left total hip arthroplasty as well as surgical internal fixation of old left acetabular fracture. No acute fracture or dislocation is noted. IMPRESSION: Postsurgical changes as noted above.  No acute abnormality seen. Electronically Signed   By: Rosalene Colon M.D.   On: 11/06/2023 16:20   CT Head Wo Contrast Result Date: 11/06/2023 CLINICAL DATA:  Provided history: Mental status change, unknown cause. Additional history provided: Generalized weakness, altered mental status. EXAM: CT HEAD WITHOUT CONTRAST TECHNIQUE: Contiguous axial images were  obtained from the base of the skull through the vertex without intravenous contrast. RADIATION DOSE REDUCTION: This exam was performed according to the departmental dose-optimization program which includes automated exposure control, adjustment of the mA and/or kV according to patient size and/or use of iterative reconstruction technique. COMPARISON:  Brain MRI 01/08/2023.  Head CT 01/08/2023. FINDINGS: Brain: There is no acute intracranial hemorrhage. No demarcated cortical infarct. No extra-axial fluid collection. No evidence of an intracranial mass.  No midline shift. Vascular: No hyperdense vessel. Skull: No calvarial fracture or aggressive osseous lesion. Sinuses/Orbits: No mass or acute finding within the imaged orbits. Mild mucosal thickening, and small-volume secretions, within the right maxillary sinus at the imaged levels. Mild mucosal thickening within the left maxillary sinus at the imaged levels. IMPRESSION: 1. No acute intracranial finding. 2. Mild maxillary sinus disease at the imaged levels. Correlate for signs/symptoms of acute sinusitis. Electronically Signed   By: Bascom Lily D.O.   On: 11/06/2023 15:12      IMPRESSION AND PLAN:  Assessment and Plan: * Altered mental status - This acute encephalopathy is likely secondary to substance abuse. - The patient admitted to a medical telemetry observation bed. - Will continue hydration with IV normal saline. - Will monitor mental status. - Will avoid sedatives. - Will manage his AKI that could be contributing to his altered mental status. - With no clinical improvement, neurology and psychiatry consults could be considered in a.m.  Will defer to morning hospitalist.  AKI (acute kidney injury) (HCC) - This is likely prerenal due to long depletion and dehydration. - He will be hydrated with IV normal saline. - Will follow BMP. - Will avoid nephrotoxins.  Peripheral neuropathy - Will continue Neurontin.  GERD without esophagitis - We will continue PPI therapy.   DVT prophylaxis: Lovenox .  Advanced Care Planning:  Code Status: full code.  Family Communication:  The plan of care was discussed in details with the patient (and family). I answered all questions. The patient agreed to proceed with the above mentioned plan. Further management will depend upon hospital course. Disposition Plan: Back to previous home environment Consults called: none.  All the records are reviewed and case discussed with ED provider.  Status is: Observation  I certify that at the time of  admission, it is my clinical judgment that the patient will require hospital care extending less than 2 midnights.                            Dispo: The patient is from: Home              Anticipated d/c is to: Home              Patient currently is not medically stable to d/c.              Difficult to place patient: No  Virgene Griffin M.D on 11/07/2023 at 3:22 AM  [facility]   From 7 PM-7 AM, contact night-coverage [email or web address]  CC: Primary care physician; Pcp, No

## 2023-11-06 NOTE — ED Notes (Signed)
 EDP, Funke made aware of heart rate; EKG performed and handed off to her.

## 2023-11-06 NOTE — Group Note (Deleted)
 Date:  11/06/2023 Time:  9:45 PM  Group Topic/Focus:  Wrap-Up Group:   The focus of this group is to help patients review their daily goal of treatment and discuss progress on daily workbooks.     Participation Level:  {BHH PARTICIPATION EAVWU:98119}  Participation Quality:  {BHH PARTICIPATION QUALITY:22265}  Affect:  {BHH AFFECT:22266}  Cognitive:  {BHH COGNITIVE:22267}  Insight: {BHH Insight2:20797}  Engagement in Group:  {BHH ENGAGEMENT IN JYNWG:95621}  Modes of Intervention:  {BHH MODES OF INTERVENTION:22269}  Additional Comments:  ***  Maglione,Preciosa Bundrick E 11/06/2023, 9:45 PM

## 2023-11-06 NOTE — ED Notes (Signed)
 See triage note  Presents with weakness and slurred speech  Presented via EMS  Per EMS his friend called because of these sxs'   On arrival to treatment room  Speech is very slurred  Lethargic

## 2023-11-06 NOTE — ED Notes (Signed)
 Pt appears more awake at present  Is able to answer questions

## 2023-11-06 NOTE — ED Provider Notes (Signed)
 [facility] Provider Note    Event Date/Time   First MD Initiated Contact with Patient 11/06/23 1401     (approximate)   History   Weakness   HPI  [name] is a 33 y.o. male with a history of polysubstance abuse on methadone treatment, hepatitis C, alcohol abuse who presents today with reports of "spacing out."  Patient is quite somnolent on exam, awakens to loud voice, but has difficulty answering questions.  He does not recall any trauma.  He reports that he used drugs today but he does not remember what kind.  He is unable to answer if he injected, smoked, or ingested drugs because he reports "I do not remember."  Reports that he has left hip pain which is chronic for him, denies any new injuries.  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   Pneumomediastinum (HCC) 12/18/2022   Nasal congestion 01/31/2012   Tobacco abuse 01/31/2012   Heartburn 01/30/2012   Generalized muscle weakness 01/29/2012   Hypermagnesemia 01/29/2012   Hyperphosphatemia 01/29/2012          Physical Exam   Triage Vital Signs: ED Triage Vitals [11/06/23 1216]  Encounter Vitals Group     BP 111/76     Systolic BP Percentile      Diastolic BP Percentile      Pulse Rate 69     Resp 20     Temp 98.3 F (36.8 C)     Temp Source Oral     SpO2 94 %     Weight 200 lb (90.7 kg)     Height 5\' 11"  (1.803 m)     Head Circumference      Peak Flow      Pain Score 0     Pain Loc      Pain Education      Exclude from Growth Chart     Most recent vital signs: Vitals:   11/06/23 1216 11/06/23 1638  BP: 111/76 110/80  Pulse: 69 68  Resp: 20 18  Temp: 98.3 F (36.8 C) 98 F (36.7 C)  SpO2: 94% 94%    Physical Exam Vitals and nursing note reviewed.  Constitutional:      General: Somnolent but arousable to loud voice. No  acute distress.  Able to tell me location, name, birthday    Appearance: Disheveled appearance. The patient is normal weight.  HENT:     Head: Normocephalic and atraumatic.     Mouth: Mucous membranes are moist.  Eyes:     General: PERRL. Normal EOMs        Right eye: No discharge.        Left eye: No discharge.     Conjunctiva/sclera: Conjunctivae normal.  Cardiovascular:     Rate and Rhythm: Normal rate and regular rhythm.     Pulses: Normal pulses.  Pulmonary:     Effort: Pulmonary effort is normal. No respiratory distress.     Breath sounds: Normal breath sounds.  Abdominal:     Abdomen is soft. There is no abdominal tenderness. No rebound or guarding. No distention. Musculoskeletal:        General: No swelling. Normal range of motion.     Cervical back: Normal range of motion and neck supple.  Skin:    General: Skin is warm and  dry.     Capillary Refill: Capillary refill takes less than 2 seconds.     Findings: No rash.  Neurological:     Mental Status: The patient is somnolent but arousable.  Normal strength and sensation in all 4 extremities.  Face is symmetric.  No asterixis     ED Results / Procedures / Treatments   Labs (all labs ordered are listed, but only abnormal results are displayed) Labs Reviewed  CBC WITH DIFFERENTIAL/PLATELET - Abnormal; Notable for the following components:      Result Value   Hemoglobin 10.7 (*)    HCT 34.1 (*)    MCV 75.6 (*)    MCH 23.7 (*)    RDW 15.6 (*)    All other components within normal limits  COMPREHENSIVE METABOLIC PANEL WITH GFR - Abnormal; Notable for the following components:   Glucose, Bld 102 (*)    BUN 42 (*)    Creatinine, Ser 1.71 (*)    Total Protein 8.5 (*)    AST 42 (*)    GFR, Estimated 53 (*)    All other components within normal limits  URINALYSIS, ROUTINE W REFLEX MICROSCOPIC - Abnormal; Notable for the following components:   Color, Urine YELLOW (*)    APPearance HAZY (*)    All other components  within normal limits  URINE DRUG SCREEN, QUALITATIVE ([facility] ONLY) - Abnormal; Notable for the following components:   Cannabinoid 50 Ng, Ur [place] POSITIVE (*)    Methadone Scn, Ur POSITIVE (*)    All other components within normal limits  AMMONIA  ETHANOL     EKG     RADIOLOGY I independently reviewed and interpreted imaging and agree with radiologists findings.     PROCEDURES:  Critical Care performed:   Procedures   MEDICATIONS ORDERED IN ED: Medications  sodium chloride  0.9 % bolus 1,000 mL (0 mLs Intravenous Stopped 11/06/23 1744)  sodium chloride  0.9 % bolus 1,000 mL (1,000 mLs Intravenous New Bag/Given 11/06/23 1806)     IMPRESSION / MDM / ASSESSMENT AND PLAN / ED COURSE  I reviewed the triage vital signs and the nursing notes.   Differential diagnosis includes, but is not limited to, substance ingestion, hepatic encephalopathy, infection, intracranial hemorrhage.  I reviewed the patient's chart.  Patient was recently admitted to [facility] in April of this year due to infected hardware from right hip arthroplasty.  Patient is somnolent but arousable to voice, though probably falls back to sleep.  Patient was reevaluated multiple times.  He has become more alert throughout his stay, though is still somnolent.  He does admit to drug use.  CT head was normal.  Labs revealed an AKI, he was hydrated with 2 L of normal saline.  Ammonia is within normal limits.  Drug screen is positive for methadone and THC.  Per chart review, he has also used methamphetamine and fentanyl , he clinically appears to have used another opiate, which often does not appear on UDS.  Case was discussed with Dr. Peggi Bowels.  Plan for continued observation until patient is clinically sober.  Patient's presentation is most consistent with acute complicated illness / injury requiring diagnostic workup.    FINAL CLINICAL IMPRESSION(S) / ED DIAGNOSES   Final diagnoses:  Altered mental  status, unspecified altered mental status type  AKI (acute kidney injury) (HCC)  Dehydration     Rx / DC Orders   ED Discharge Orders     None  Note:  This document was prepared using Dragon voice recognition software and may include unintentional dictation errors.   Mariana Wiederholt E, PA-C 11/06/23 1937    Lubertha Rush, MD 11/06/23 [phone]

## 2023-11-07 ENCOUNTER — Ambulatory Visit: Payer: MEDICAID

## 2023-11-07 ENCOUNTER — Observation Stay: Payer: MEDICAID

## 2023-11-07 ENCOUNTER — Observation Stay: Admit: 2023-11-07 | Discharge: 2023-11-07 | Disposition: A | Discharge status: Home / Self Care | Payer: MEDICAID

## 2023-11-07 DIAGNOSIS — K5900 Constipation, unspecified: Secondary | ICD-10-CM | POA: Diagnosis present

## 2023-11-07 DIAGNOSIS — Z9049 Acquired absence of other specified parts of digestive tract: Secondary | ICD-10-CM | POA: Diagnosis not present

## 2023-11-07 DIAGNOSIS — T403X1A Poisoning by methadone, accidental (unintentional), initial encounter: Secondary | ICD-10-CM | POA: Diagnosis present

## 2023-11-07 DIAGNOSIS — K219 Gastro-esophageal reflux disease without esophagitis: Secondary | ICD-10-CM | POA: Insufficient documentation

## 2023-11-07 DIAGNOSIS — G629 Polyneuropathy, unspecified: Secondary | ICD-10-CM | POA: Diagnosis present

## 2023-11-07 DIAGNOSIS — Z8249 Family history of ischemic heart disease and other diseases of the circulatory system: Secondary | ICD-10-CM | POA: Diagnosis not present

## 2023-11-07 DIAGNOSIS — R4182 Altered mental status, unspecified: Secondary | ICD-10-CM | POA: Diagnosis not present

## 2023-11-07 DIAGNOSIS — N179 Acute kidney failure, unspecified: Secondary | ICD-10-CM

## 2023-11-07 DIAGNOSIS — G928 Other toxic encephalopathy: Secondary | ICD-10-CM | POA: Diagnosis present

## 2023-11-07 DIAGNOSIS — R569 Unspecified convulsions: Secondary | ICD-10-CM | POA: Diagnosis not present

## 2023-11-07 DIAGNOSIS — B9562 Methicillin resistant Staphylococcus aureus infection as the cause of diseases classified elsewhere: Secondary | ICD-10-CM | POA: Diagnosis present

## 2023-11-07 DIAGNOSIS — Z79899 Other long term (current) drug therapy: Secondary | ICD-10-CM | POA: Diagnosis not present

## 2023-11-07 DIAGNOSIS — E860 Dehydration: Secondary | ICD-10-CM | POA: Diagnosis present

## 2023-11-07 DIAGNOSIS — R400 Somnolence: Secondary | ICD-10-CM

## 2023-11-07 DIAGNOSIS — Z7982 Long term (current) use of aspirin: Secondary | ICD-10-CM | POA: Diagnosis not present

## 2023-11-07 DIAGNOSIS — Z8619 Personal history of other infectious and parasitic diseases: Secondary | ICD-10-CM | POA: Diagnosis not present

## 2023-11-07 DIAGNOSIS — F1010 Alcohol abuse, uncomplicated: Secondary | ICD-10-CM | POA: Diagnosis present

## 2023-11-07 DIAGNOSIS — T8452XA Infection and inflammatory reaction due to internal left hip prosthesis, initial encounter: Secondary | ICD-10-CM | POA: Diagnosis present

## 2023-11-07 DIAGNOSIS — Z792 Long term (current) use of antibiotics: Secondary | ICD-10-CM | POA: Diagnosis not present

## 2023-11-07 DIAGNOSIS — Y831 Surgical operation with implant of artificial internal device as the cause of abnormal reaction of the patient, or of later complication, without mention of misadventure at the time of the procedure: Secondary | ICD-10-CM | POA: Diagnosis present

## 2023-11-07 DIAGNOSIS — F17210 Nicotine dependence, cigarettes, uncomplicated: Secondary | ICD-10-CM | POA: Diagnosis present

## 2023-11-07 DIAGNOSIS — Z5986 Financial insecurity: Secondary | ICD-10-CM | POA: Diagnosis not present

## 2023-11-07 DIAGNOSIS — Z5982 Transportation insecurity: Secondary | ICD-10-CM | POA: Diagnosis not present

## 2023-11-07 DIAGNOSIS — F1510 Other stimulant abuse, uncomplicated: Secondary | ICD-10-CM | POA: Diagnosis present

## 2023-11-07 LAB — BASIC METABOLIC PANEL WITH GFR
Anion gap: 7 (ref 5–15)
Anion gap: 5 (ref 5–15)
BUN: 35 mg/dL — ABNORMAL HIGH (ref 6–20)
BUN: 29 mg/dL — ABNORMAL HIGH (ref 6–20)
CO2: 23 mmol/L (ref 22–32)
CO2: 25 mmol/L (ref 22–32)
Calcium: 8.6 mg/dL — ABNORMAL LOW (ref 8.9–10.3)
Calcium: 8.6 mg/dL — ABNORMAL LOW (ref 8.9–10.3)
Chloride: 109 mmol/L (ref 98–111)
Chloride: 108 mmol/L (ref 98–111)
Creatinine, Ser: 0.97 mg/dL (ref 0.61–1.24)
Creatinine, Ser: 0.96 mg/dL (ref 0.61–1.24)
GFR, Estimated: >60 mL/min (ref >60)
GFR, Estimated: >60 mL/min (ref >60)
Glucose, Bld: 79 mg/dL (ref 70–99)
Glucose, Bld: 100 mg/dL — ABNORMAL HIGH (ref 70–99)
Potassium: 3.9 mmol/L (ref 3.5–5.1)
Potassium: 3.7 mmol/L (ref 3.5–5.1)
Sodium: 139 mmol/L (ref 135–145)
Sodium: 138 mmol/L (ref 135–145)

## 2023-11-07 LAB — CBC
HCT: 28 % — ABNORMAL LOW (ref 39.0–52.0)
Hemoglobin: 8.8 g/dL — ABNORMAL LOW (ref 13.0–17.0)
MCH: 24.1 pg — ABNORMAL LOW (ref 26.0–34.0)
MCHC: 31.4 g/dL (ref 30.0–36.0)
MCV: 76.7 fL — ABNORMAL LOW (ref 80.0–100.0)
Platelets: 194 10*3/uL (ref 150–400)
RBC: 3.65 MIL/uL — ABNORMAL LOW (ref 4.22–5.81)
RDW: 15.4 % (ref 11.5–15.5)
WBC: 5.9 10*3/uL (ref 4.0–10.5)
nRBC: 0 % (ref 0.0–0.2)

## 2023-11-07 LAB — MAGNESIUM
Magnesium: 2 mg/dL (ref 1.7–2.4)
Magnesium: 1.9 mg/dL (ref 1.7–2.4)

## 2023-11-07 LAB — TSH: TSH: 1.452 u[IU]/mL (ref 0.350–4.500)

## 2023-11-07 LAB — CK: Total CK: 856 U/L — ABNORMAL HIGH (ref 49–397)

## 2023-11-07 LAB — VITAMIN B12: Vitamin B-12: 385 pg/mL (ref 180–914)

## 2023-11-07 MED ORDER — DAPTOMYCIN 500 MG IV SOLR: 650.0000 mg | Freq: Every day | INTRAVENOUS | Status: DC

## 2023-11-07 MED ORDER — POTASSIUM CHLORIDE CRYS ER 20 MEQ PO TBCR
40.0000 meq | EXTENDED_RELEASE_TABLET | Freq: Once | ORAL | Status: AC
  Administered 2023-11-07: 40 meq via ORAL
  Filled 2023-11-07: qty 2

## 2023-11-07 MED ORDER — LEVOFLOXACIN 500 MG PO TABS
750.0000 mg | ORAL_TABLET | Freq: Once | ORAL | Status: AC
  Administered 2023-11-07: 750 mg via ORAL
  Filled 2023-11-07: qty 2

## 2023-11-07 MED ORDER — GLECAPREVIR-PIBRENTASVIR 100-40 MG PO TABS
3.0000 | ORAL_TABLET | Freq: Every day | ORAL | Status: DC
  Filled 2023-11-07: qty 3

## 2023-11-07 MED ORDER — MAGNESIUM OXIDE -MG SUPPLEMENT 400 (240 MG) MG PO TABS
800.0000 mg | ORAL_TABLET | Freq: Once | ORAL | Status: AC
  Administered 2023-11-07: 800 mg via ORAL
  Filled 2023-11-07: qty 2

## 2023-11-07 MED ORDER — GADOBUTROL 1 MMOL/ML IV SOLN
7.5000 mL | Freq: Once | INTRAVENOUS | Status: DC | PRN
Start: 2023-11-07 — End: 2023-11-08

## 2023-11-07 MED ORDER — ATROPINE SULFATE 1 MG/10ML IJ SOSY
0.5000 mg | PREFILLED_SYRINGE | Freq: Once | INTRAMUSCULAR | Status: AC
  Administered 2023-11-07: 0.5 mg via INTRAVENOUS
  Filled 2023-11-07: qty 10

## 2023-11-07 MED ORDER — DAPTOMYCIN-SODIUM CHLORIDE 700-0.9 MG/100ML-% IV SOLN
700.0000 mg | Freq: Once | INTRAVENOUS | Status: DC
  Administered 2023-11-07: 700 mg via INTRAVENOUS
  Filled 2023-11-07: qty 100

## 2023-11-07 MED ORDER — THIAMINE HCL 100 MG/ML IJ SOLN
500.0000 mg | Freq: Three times a day (TID) | INTRAVENOUS | Status: DC
  Administered 2023-11-07 (×3): 500 mg via INTRAVENOUS
  Filled 2023-11-07 (×6): qty 5

## 2023-11-07 NOTE — ED Notes (Signed)
 Patient has arrived from C-pod is very lethargic.  Patient has eyes open and is making groaning noises, but is not responding to the environment around him.  Patient has heart rate in upper 30's to low 40's with soft BP on arrival.

## 2023-11-07 NOTE — ED Notes (Signed)
 Pt HR noted to be dropping into the upper 30's, and maintaining mostly in the mid 40's. Pt with no complaints at this time. MD notified.

## 2023-11-07 NOTE — Assessment & Plan Note (Addendum)
-   This acute encephalopathy is likely secondary to substance abuse. - The patient admitted to a medical telemetry observation bed. - Will continue hydration with IV normal saline. - Will monitor mental status. - Will avoid sedatives. - Will manage his AKI that could be contributing to his altered mental status. - With no clinical improvement, neurology and psychiatry consults could be considered in a.m.  Will defer to morning hospitalist.

## 2023-11-07 NOTE — Plan of Care (Signed)
 Alert Dr. Vallarie Gauze to pt's HR sustaining in 40s.  Let her know he had been evaluated by Cardiology today.  Pt is A&Ox4 but lethargic/somnolent. She ordered a BMP and Mg.  K+ 3.7 and Mg 1.9.  Replaced both w/PO meds.  EEG will be done tomorrow.

## 2023-11-07 NOTE — ED Notes (Signed)
 Notified Mansy, MD that pts HR now 35-43bpm. Per Mansy lets give one more dose 0.5mg  of atropine and I will have Cards follow up. Order read back and verified by Achilles Holes, MD. Charge RN made aware, pt moved from cpod to main ed.

## 2023-11-07 NOTE — ED Notes (Addendum)
 Message to [facility], MD: Pts hr did come up for a little while after the 0.5mg  of atropine, but has dropped back down into the mid to low 40s. BP is 102/68, pt will wake from time to time and scroll around on his phone, but even when awake HR remains the same.   Per Achilles Holes, MD, continue to watch HR for now.

## 2023-11-07 NOTE — ED Notes (Signed)
Lunch tray at the bedside 

## 2023-11-07 NOTE — Consult Note (Signed)
 [facility] CARDIOLOGY CONSULT NOTE       Patient ID: [name] MRN: [id number] DOB/AGE: 12-21-1990 32 y.o.  Admit date: 11/06/2023 Referring Physician Dr. Sari Cunning Primary Physician Pcp, No Primary Cardiologist None Reason for Consultation Bradycardia  HPI: [name] is a 33 y.o. male  with a past medical history of polysubstance abuse (on methadone), chronic hepatitis C, and no known cardiac history who presented to the ED on 11/06/2023 for altered mental status with generalized weakness. Patient uses marijuana and methamphetamines. EKG in ED showed sinus bradycardia with rate 44 bpm. Cardiology was consulted for further evaluation.   Patient presented to the ED due to AMS.Work up in the ED notable for Na 141, K 4.7, Cr 1.71, Hgb 10.7, plts 285. UDS positive for methadone. CT head and MRI brain with no acute intracranial finding. EKG in ED with sinus bradycardia, rate 44 bpm. Patient received 2 doses of IV atropine.   At the time of my evaluation this morning, patient was laying in ED stretcher in no acute distress. Patient somnolent but arousable and answered a few questions. Patient states he feels fine and without chest pain, lightheadedness, dizziness or SOB. Per tele patient remains in sinus bradycardia with no evidence of high grade AVB or significant pauses.    Review of systems complete and found to be negative unless listed above    Past Medical History:  Diagnosis Date   AKI (acute kidney injury) (HCC)    01/2012 resolved   AMS (altered mental status) 10/01/2021   Elevated CK 01/29/2012   Hypokalemia 01/30/2012   Hyponatremia 01/29/2012   Maculopapular rash, generalized 01/29/2012   Rhabdomyolysis 10/01/2021    Past Surgical History:  Procedure Laterality Date   APPENDECTOMY  2007   ruptured    HIP SURGERY      Medications Prior to Admission  Medication Sig Dispense Refill Last Dose/Taking   aspirin EC 81 MG tablet Take 81 mg by mouth 2 (two) times daily.    Unknown   DAPTOMYCIN IV Inject 650 mg into the vein daily.  13 ml IVP   Unknown   ferrous sulfate 325 (65 FE) MG EC tablet Take 1 tablet by mouth daily with breakfast.   Unknown   gabapentin (NEURONTIN) 300 MG capsule Take 1 capsule by mouth 3 (three) times daily.   Unknown   Glecaprevir-Pibrentasvir (MAVYRET) 100-40 MG TABS Take 3 tablets by mouth daily. Take 3 tablets by mouth once daily Take with food. Do not miss or skip doses. If a dose is missed and it is: more than 18 hours from the usual time that MAVYRET should have been taken- do not administer the missed dose and take the next dose at the usual time. less than 18 hours from the usual time that MAVYRET should have been taken - administer the dose as soon as possible and then to take the next dose at the usual time.   Unknown   levofloxacin (LEVAQUIN) 750 MG tablet Take 750 mg by mouth daily.   Unknown   methadone (DOLOPHINE) 1 MG/1ML solution Take 140 mg by mouth daily.   Unknown   naloxone  (NARCAN ) nasal spray 4 mg/0.1 mL For suspected opioid overdose, spray one device into the nostril, then call 911.  Repeat a second dose into other nostril if not response 2-3 minutes after first dose. 2 each 1 Unknown   senna-docusate (SENOKOT-S) 8.6-50 MG tablet Take 2 tablets by mouth daily. (Patient taking differently: Take 2 tablets by mouth  2 (two) times daily.) 60 tablet 1 Unknown   fentaNYL  (DURAGESIC ) 25 MCG/HR Place 1 patch onto the skin every 3 (three) days. (Patient not taking: Reported on 11/06/2023) 5 patch 0 Not Taking   naproxen  (NAPROSYN ) 500 MG tablet Take 1 tablet (500 mg total) by mouth 2 (two) times daily with a meal. (Patient not taking: Reported on 11/06/2023) 60 tablet 1 Not Taking   oxyCODONE  (ROXICODONE ) 15 MG immediate release tablet Take 1 tablet (15 mg total) by mouth every 6 (six) hours as needed for breakthrough pain. (Patient not taking: Reported on 11/06/2023) 30 tablet 0 Not Taking   pantoprazole  (PROTONIX ) 40 MG tablet Take 1  tablet (40 mg total) by mouth 2 (two) times daily. (Patient not taking: Reported on 11/06/2023) 60 tablet 1 Not Taking   polyethylene glycol (MIRALAX  / GLYCOLAX ) 17 g packet Take 17 g by mouth daily. (Patient not taking: Reported on 11/06/2023) 30 each 1 Not Taking   Social History   Socioeconomic History   Marital status: Single    Spouse name: Not on file   Number of children: Not on file   Years of education: Not on file   Highest education level: Not on file  Occupational History   Not on file  Tobacco Use   Smoking status: Every Day    Current packs/day: 0.50    Average packs/day: 0.5 packs/day for 8.0 years (4.0 ttl pk-yrs)    Types: Cigarettes   Smokeless tobacco: Current    Types: Chew  Substance and Sexual Activity   Alcohol use: Yes    Alcohol/week: 1.0 - 2.0 standard drink of alcohol    Types: 1 - 2 Cans of beer per week   Drug use: Yes    Frequency: 7.0 times per week    Types: Marijuana   Sexual activity: Yes  Other Topics Concern   Not on file  Social History Narrative   Lives in [place]. Lives at parents house. Finished 9th grade. 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     No insurance.    Social Drivers of Corporate investment banker Strain: High Risk (10/09/2023)   Received from [facility]   Overall Financial Resource Strain (CARDIA)    Difficulty of Paying Living Expenses: Hard  Food Insecurity: No Food Insecurity (11/07/2023)   Hunger Vital Sign    Worried About Running Out of Food in the Last Year: Never true    Ran Out of Food in the Last Year: Never true  Recent Concern: Food Insecurity - Food Insecurity Present (10/09/2023)   Received from [facility]   Hunger Vital Sign    Worried About Running Out of Food in the Last Year: Sometimes true    Ran Out of Food in the Last Year: Sometimes true  Transportation Needs: Unmet Transportation Needs (11/07/2023)   PRAPARE - Administrator, Civil Service  (Medical): Yes    Lack of Transportation (Non-Medical): Yes  Physical Activity: Not on file  Stress: Not on file  Social Connections: Unknown (11/07/2023)   Social Connection and Isolation Panel [NHANES]    Frequency of Communication with Friends and Family: Not on file    Frequency of Social Gatherings with Friends and Family: Not on file    Attends Religious Services: Not on file    Active Member of Clubs or Organizations: Not on file    Attends Banker Meetings: Not on file    Marital Status: Never  married  Intimate Partner Violence: Not At Risk (11/07/2023)   Humiliation, Afraid, Rape, and Kick questionnaire    Fear of Current or Ex-Partner: No    Emotionally Abused: No    Physically Abused: No    Sexually Abused: No    Family History  Problem Relation Age of Onset   Hypertension Father      Vitals:   11/07/23 1015 11/07/23 1417 11/07/23 1530 11/07/23 1640  BP:   92/67 94/60  Pulse:    (!) 40  Resp: 11  12   Temp:  97.8 F (36.6 C)  98 F (36.7 C)  TempSrc:    Oral  SpO2:    99%  Weight:    90.7 kg  Height:    6' (1.829 m)    PHYSICAL EXAM General: chronically ill appear male, well nourished, in no acute distress. HEENT: Normocephalic and atraumatic. Neck: No JVD.   Lungs: Normal respiratory effort on . Clear bilaterally to auscultation. No wheezes, crackles, rhonchi.  Heart: HRR, slow rates. Normal S1 and S2 without gallops or murmurs.  Abdomen: Non-distended appearing.  Msk: Normal strength and tone for age. Extremities: Warm and well perfused. No clubbing, cyanosis. No edema.   Labs: Basic Metabolic Panel: Recent Labs    11/06/23 1220 11/07/23 0429  NA 141 139  K 4.7 3.9  CL 106 109  CO2 24 23  GLUCOSE 102* 79  BUN 42* 35*  CREATININE 1.71* 0.97  CALCIUM 9.6 8.6*  MG  --  2.0   Liver Function Tests: Recent Labs    11/06/23 1220  AST 42*  ALT 33  ALKPHOS 75  BILITOT 0.6  PROT 8.5*  ALBUMIN 4.1   No results for input(s):  "LIPASE", "AMYLASE" in the last 72 hours. CBC: Recent Labs    11/06/23 1220 11/07/23 0429  WBC 8.0 5.9  NEUTROABS 5.5  --   HGB 10.7* 8.8*  HCT 34.1* 28.0*  MCV 75.6* 76.7*  PLT 285 194   Cardiac Enzymes: Recent Labs    11/07/23 0429  CKTOTAL 856*   BNP: No results for input(s): "BNP" in the last 72 hours. D-Dimer: No results for input(s): "DDIMER" in the last 72 hours. Hemoglobin A1C: No results for input(s): "HGBA1C" in the last 72 hours. Fasting Lipid Panel: No results for input(s): "CHOL", "HDL", "LDLCALC", "TRIG", "CHOLHDL", "LDLDIRECT" in the last 72 hours. Thyroid Function Tests: Recent Labs    11/07/23 0429  TSH 1.452   Anemia Panel: Recent Labs    11/06/23 1523  VITAMINB12 385     Radiology: MR BRAIN WO CONTRAST Result Date: 11/07/2023 CLINICAL DATA:  Initial evaluation for acute neuro deficit, stroke. EXAM: MRI HEAD WITHOUT CONTRAST TECHNIQUE: Multiplanar, multiecho pulse sequences of the brain and surrounding structures were obtained without intravenous contrast. COMPARISON:  CT from 11/06/2023 FINDINGS: Brain: Examination technically limited as the patient was unable to tolerate the full length of the exam. Additionally, provided images are intermittently degraded by motion, particularly the FLAIR sequence. Cerebral volume within normal limits. No focal parenchymal signal abnormality or significant cerebral white matter disease. No evidence for acute or subacute infarct. No recent chronic cortical infarction. Evaluation for intracranial blood products limited given lack of SWI sequence. No mass lesion, midline shift or mass effect. No hydrocephalus or extra-axial fluid collection. Pituitary gland within normal limits. Vascular: Major intracranial vascular flow voids are maintained. Skull and upper cervical spine: Craniocervical junction within normal limits. Bone marrow signal intensity grossly normal. No scalp soft tissue abnormality.  Sinuses/Orbits: Globes  orbital soft tissues within normal limits. Mild mucosal thickening noted about the ethmoidal air cells and maxillary sinuses. Paranasal sinuses are otherwise clear. No mastoid effusion. Other: None. IMPRESSION: 1. Technically limited truncated exam due to the patient's inability to tolerate the full length of the study. 2. Grossly normal brain MRI. No acute intracranial abnormality identified. Electronically Signed   By: Virgia Griffins M.D.   On: 11/07/2023 04:17   DG Hip Unilat With Pelvis 2-3 Views Left Result Date: 11/06/2023 CLINICAL DATA:  Left hip pain. EXAM: DG HIP (WITH OR WITHOUT PELVIS) 2-3V LEFT COMPARISON:  January 08, 2023. FINDINGS: Status post left total hip arthroplasty as well as surgical internal fixation of old left acetabular fracture. No acute fracture or dislocation is noted. IMPRESSION: Postsurgical changes as noted above.  No acute abnormality seen. Electronically Signed   By: Rosalene Colon M.D.   On: 11/06/2023 16:20   CT Head Wo Contrast Result Date: 11/06/2023 CLINICAL DATA:  Provided history: Mental status change, unknown cause. Additional history provided: Generalized weakness, altered mental status. EXAM: CT HEAD WITHOUT CONTRAST TECHNIQUE: Contiguous axial images were obtained from the base of the skull through the vertex without intravenous contrast. RADIATION DOSE REDUCTION: This exam was performed according to the departmental dose-optimization program which includes automated exposure control, adjustment of the mA and/or kV according to patient size and/or use of iterative reconstruction technique. COMPARISON:  Brain MRI 01/08/2023.  Head CT 01/08/2023. FINDINGS: Brain: There is no acute intracranial hemorrhage. No demarcated cortical infarct. No extra-axial fluid collection. No evidence of an intracranial mass. No midline shift. Vascular: No hyperdense vessel. Skull: No calvarial fracture or aggressive osseous lesion. Sinuses/Orbits: No mass or acute finding within the  imaged orbits. Mild mucosal thickening, and small-volume secretions, within the right maxillary sinus at the imaged levels. Mild mucosal thickening within the left maxillary sinus at the imaged levels. IMPRESSION: 1. No acute intracranial finding. 2. Mild maxillary sinus disease at the imaged levels. Correlate for signs/symptoms of acute sinusitis. Electronically Signed   By: Bascom Lily D.O.   On: 11/06/2023 15:12    ECHO pending  TELEMETRY reviewed by me 11/07/2023: sinus bradycardia, rate 50s (no evidence of high grade AVB or significant pauses).  EKG reviewed by me: sinus bradycardia, rate 44 bpm  Data reviewed by me 11/07/2023: last 24h vitals tele labs imaging I/O ED provider note, admission H&P.  Principal Problem:   Altered mental status Active Problems:   Tobacco abuse   Methamphetamine abuse (HCC)   Alcohol abuse   AKI (acute kidney injury) (HCC)   GERD without esophagitis   Peripheral neuropathy   Methadone use   Mild fentanyl  abuse (HCC)    ASSESSMENT AND PLAN:  SEVON ROTERT is a 33 y.o. male  with a past medical history of polysubstance abuse (on methadone), chronic hepatitis C, and no known cardiac history who presented to the ED on 11/06/2023 for altered mental status with generalized weakness. Patient uses marijuana and methamphetamines. EKG in ED showed sinus bradycardia with rate 44 bpm. Cardiology was consulted for further evaluation.   # Altered Mental Status # Polysubstance abuse # Sinus bradycardia Patient with no known cardiac hx and hx of polysubstance abuse presents to ED with AMS and denies chest pain or palpitations. UDS positive for methadone. CT head and MRI brain with no acute intracranial finding. EKG in ED with sinus bradycardia, rate 44 bpm. Patient received 2 doses of IV atropine. Per tele patient has remains  in sinus bradycardia with no evidence of high grade AVB or significant pauses.  -Echo pending. Further recommendations pending results.  -Monitor  and replenish electrolytes for a goal K >4, Mag >2  -Avoid AVN blockers has patient has remained bradycardic since admission.    This patient's plan of care was discussed and created with Dr. Beau Bound and he is in agreement.  Signed: Creighton Doffing, PA-C  11/07/2023, 6:05 PM [facility]

## 2023-11-07 NOTE — ED Notes (Signed)
 Pt sitting up in bed eating breakfast at this time. Speech is slurred and hard to understand.

## 2023-11-07 NOTE — ED Notes (Addendum)
 Pts HR 35-42bpm, bp 92/54, pt a&ox4 but drowsy. Mansy, MD made aware see new orders

## 2023-11-07 NOTE — Assessment & Plan Note (Signed)
 -  We will continue PPI therapy

## 2023-11-07 NOTE — ED Notes (Signed)
 Pt resting at this time.

## 2023-11-07 NOTE — Progress Notes (Signed)
 PROGRESS NOTE    [name]  ZOX:[id number] DOB: July 23, 1990 DOA: 11/06/2023 PCP: Vicente Graham, No  Outpatient Specialists: [facility], [facility]    Brief Narrative:   From admission h and p  [name] is a 33 y.o. Caucasian male with medical history significant for peripheral neuropathy, substance abuse cleaning alcohol, tobacco marijuana and methamphetamine, chronic hepatitis C and GERD, who presented to the emergency room with acute onset of altered mental status with generalized weakness.  He stated that he was not having much energy.  He was having left hip pain.  He denied any chest pain or palpitations.  No cough or wheezing or dyspnea.  No fever or chills.  No headache or dizziness.  She admitted to constipation.  He was fairly somnolent but arousable.  No dysuria, oliguria or hematuria or flank pain.  He admitted to using marijuana yesterday and methamphetamine about a couple months ago.  His urine drug screen was positive for methadone and cannabinoids.    Assessment & Plan:   Principal Problem:   Altered mental status Active Problems:   AKI (acute kidney injury) (HCC)   Tobacco abuse   Methamphetamine abuse (HCC)   Alcohol abuse   GERD without esophagitis   Peripheral neuropathy   Methadone use   Mild fentanyl  abuse (HCC)  # acute encephalopathy Suspect 2/2 methadone, aki, possibly other substances. Did have recent infection as below. 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 Has been on levofloxacin and daptomycin since then, end date today. X-ray left hip yesterday showed nothing acute - will give final dose of levo and dapto now - blood culture as above  # Opioid use disorder Concern for  toxic effects of methadone - holding methadone  # Bradycardia Suspect 2/2 methadone - will review with cardiology  # Hepatitis c Recently started on mavyret - continue mavyret  DVT prophylaxis: lovenox  Code Status: full Family Communication: mother updated telephonically 5/21  Level of care: Telemetry Medical Status is: Observation    Consultants:  none  Procedures: none  Antimicrobials:  See above    Subjective: No complaints  Objective: Vitals:   11/07/23 0615 11/07/23 0615 11/07/23 0630 11/07/23 0700  BP: 113/86  117/78 111/71  Pulse: 60  64 60  Resp: 16  15 15   Temp:  (!) 97.5 F (36.4 C)    TempSrc:  Oral    SpO2: 94%  97% 92%  Weight:      Height:        Intake/Output Summary (Last 24 hours) at 11/07/2023 0846 Last data filed at 11/07/2023 0209 Gross per 24 hour  Intake 1500 ml  Output 600 ml  Net 900 ml   Filed Weights   11/06/23 1216  Weight: 90.7 kg    Examination:  General exam: chronically ill appearing Respiratory system: Clear to auscultation. Respiratory effort normal. Cardiovascular system: S1 & S2 heard, bradycardic Gastrointestinal system: Abdomen is nondistended, soft and nontender. Central nervous system: somnolent but arouses, moves all 4 Extremities: Symmetric 5 x 5 power. Skin: No rashes, lesions or ulcers. Healed surgical scar left lateral hip no erythema Psychiatry: calm    Data Reviewed: I have personally reviewed following labs and imaging studies  CBC: Recent Labs  Lab  11/06/23 1220 11/07/23 0429  WBC 8.0 5.9  NEUTROABS 5.5  --   HGB 10.7* 8.8*  HCT 34.1* 28.0*  MCV 75.6* 76.7*  PLT 285 194   Basic Metabolic Panel: Recent Labs  Lab 11/06/23 1220 11/07/23 0429  NA 141 139  K 4.7 3.9  CL 106 109  CO2 24 23  GLUCOSE 102* 79  BUN 42* 35*  CREATININE 1.71* 0.97  CALCIUM 9.6 8.6*   GFR: Estimated Creatinine Clearance: 123.7 mL/min (by C-G formula based on SCr of 0.97 mg/dL). Liver Function  Tests: Recent Labs  Lab 11/06/23 1220  AST 42*  ALT 33  ALKPHOS 75  BILITOT 0.6  PROT 8.5*  ALBUMIN 4.1   No results for input(s): "LIPASE", "AMYLASE" in the last 168 hours. Recent Labs  Lab 11/06/23 1220  AMMONIA 32   Coagulation Profile: No results for input(s): "INR", "PROTIME" in the last 168 hours. Cardiac Enzymes: No results for input(s): "CKTOTAL", "CKMB", "CKMBINDEX", "TROPONINI" in the last 168 hours. BNP (last 3 results) No results for input(s): "PROBNP" in the last 8760 hours. HbA1C: No results for input(s): "HGBA1C" in the last 72 hours. CBG: No results for input(s): "GLUCAP" in the last 168 hours. Lipid Profile: No results for input(s): "CHOL", "HDL", "LDLCALC", "TRIG", "CHOLHDL", "LDLDIRECT" in the last 72 hours. Thyroid Function Tests: No results for input(s): "TSH", "T4TOTAL", "FREET4", "T3FREE", "THYROIDAB" in the last 72 hours. Anemia Panel: No results for input(s): "VITAMINB12", "FOLATE", "FERRITIN", "TIBC", "IRON", "RETICCTPCT" in the last 72 hours. Urine analysis:    Component Value Date/Time   COLORURINE YELLOW (A) 11/06/2023 1320   APPEARANCEUR HAZY (A) 11/06/2023 1320   LABSPEC 1.021 11/06/2023 1320   PHURINE 5.0 11/06/2023 1320   GLUCOSEU NEGATIVE 11/06/2023 1320   HGBUR NEGATIVE 11/06/2023 1320   BILIRUBINUR NEGATIVE 11/06/2023 1320   KETONESUR NEGATIVE 11/06/2023 1320   PROTEINUR NEGATIVE 11/06/2023 1320   UROBILINOGEN 0.2 01/30/2012 0100   NITRITE NEGATIVE 11/06/2023 1320   LEUKOCYTESUR NEGATIVE 11/06/2023 1320   Sepsis Labs: @LABRCNTIP (procalcitonin:4,lacticidven:4)  )No results found for this or any previous visit (from the past 240 hours).       Radiology Studies: MR BRAIN WO CONTRAST Result Date: 11/07/2023 CLINICAL DATA:  Initial evaluation for acute neuro deficit, stroke. EXAM: MRI HEAD WITHOUT CONTRAST TECHNIQUE: Multiplanar, multiecho pulse sequences of the brain and surrounding structures were obtained without  intravenous contrast. COMPARISON:  CT from 11/06/2023 FINDINGS: Brain: Examination technically limited as the patient was unable to tolerate the full length of the exam. Additionally, provided images are intermittently degraded by motion, particularly the FLAIR sequence. Cerebral volume within normal limits. No focal parenchymal signal abnormality or significant cerebral white matter disease. No evidence for acute or subacute infarct. No recent chronic cortical infarction. Evaluation for intracranial blood products limited given lack of SWI sequence. No mass lesion, midline shift or mass effect. No hydrocephalus or extra-axial fluid collection. Pituitary gland within normal limits. Vascular: Major intracranial vascular flow voids are maintained. Skull and upper cervical spine: Craniocervical junction within normal limits. Bone marrow signal intensity grossly normal. No scalp soft tissue abnormality. Sinuses/Orbits: Globes orbital soft tissues within normal limits. Mild mucosal thickening noted about the ethmoidal air cells and maxillary sinuses. Paranasal sinuses are otherwise clear. No mastoid effusion. Other: None. IMPRESSION: 1. Technically limited truncated exam due to the patient's inability to tolerate the full length of the study. 2. Grossly normal brain MRI. No acute intracranial abnormality identified. Electronically Signed   By: Elenor Griffith.D.  On: 11/07/2023 04:17   DG Hip Unilat With Pelvis 2-3 Views Left Result Date: 11/06/2023 CLINICAL DATA:  Left hip pain. EXAM: DG HIP (WITH OR WITHOUT PELVIS) 2-3V LEFT COMPARISON:  January 08, 2023. FINDINGS: Status post left total hip arthroplasty as well as surgical internal fixation of old left acetabular fracture. No acute fracture or dislocation is noted. IMPRESSION: Postsurgical changes as noted above.  No acute abnormality seen. Electronically Signed   By: Rosalene Colon M.D.   On: 11/06/2023 16:20   CT Head Wo Contrast Result Date:  11/06/2023 CLINICAL DATA:  Provided history: Mental status change, unknown cause. Additional history provided: Generalized weakness, altered mental status. EXAM: CT HEAD WITHOUT CONTRAST TECHNIQUE: Contiguous axial images were obtained from the base of the skull through the vertex without intravenous contrast. RADIATION DOSE REDUCTION: This exam was performed according to the departmental dose-optimization program which includes automated exposure control, adjustment of the mA and/or kV according to patient size and/or use of iterative reconstruction technique. COMPARISON:  Brain MRI 01/08/2023.  Head CT 01/08/2023. FINDINGS: Brain: There is no acute intracranial hemorrhage. No demarcated cortical infarct. No extra-axial fluid collection. No evidence of an intracranial mass. No midline shift. Vascular: No hyperdense vessel. Skull: No calvarial fracture or aggressive osseous lesion. Sinuses/Orbits: No mass or acute finding within the imaged orbits. Mild mucosal thickening, and small-volume secretions, within the right maxillary sinus at the imaged levels. Mild mucosal thickening within the left maxillary sinus at the imaged levels. IMPRESSION: 1. No acute intracranial finding. 2. Mild maxillary sinus disease at the imaged levels. Correlate for signs/symptoms of acute sinusitis. Electronically Signed   By: Bascom Lily D.O.   On: 11/06/2023 15:12        Scheduled Meds:  DAPTOmycin  650 mg Intravenous Daily   enoxaparin  (LOVENOX ) injection  40 mg Subcutaneous Q24H   levofloxacin  750 mg Oral Once   pantoprazole   40 mg Oral BID   polyethylene glycol  17 g Oral Daily   senna-docusate  2 tablet Oral Daily   Continuous Infusions:  sodium chloride  100 mL/hr at 11/07/23 0057     LOS: 0 days     Raymonde Calico, MD [facility]   If 7PM-7AM, please contact night-coverage [email or web address] Password [email or web address] 11/07/2023, 8:46 AM

## 2023-11-07 NOTE — ED Notes (Signed)
 Pt provided with urinal. No other needs expressed at this time.

## 2023-11-07 NOTE — Assessment & Plan Note (Signed)
Will continue Neurontin.

## 2023-11-07 NOTE — Progress Notes (Signed)
       CROSS COVER NOTE  NAME: [name] MRN: [id number] DOB : 1990/09/29    Concern as stated by nurse / staff   Dr. Vallarie Gauze, he's been a yellow mews all day, but his HR is 40. He's been brady since admission today. Cardiology did see him. He was adm for weakness for 1 week and AMS 1 month. He is a polysubstance abuser who went home home w/PICC line for abx for hip infection. His color is bad and he's very lethargic.      Pertinent findings on chart review: Cardiologytnote reviewed with the following recommendations: "Patient with no known cardiac hx and hx of polysubstance abuse presents to ED with AMS and denies chest pain or palpitations. UDS positive for methadone. CT head and MRI brain with no acute intracranial finding. EKG in ED with sinus bradycardia, rate 44 bpm. Patient received 2 doses of IV atropine. Per tele patient has remains in sinus bradycardia with no evidence of high grade AVB or significant pauses.  -Echo pending. Further recommendations pending results.  -Monitor and replenish electrolytes for a goal K >4, Mag >2  -Avoid AVN blockers has patient has remained bradycardic since admission"    11/07/2023    7:59 PM 11/07/2023    4:40 PM 11/07/2023    3:30 PM  Vitals with BMI  Height  6\' 0"    Weight  200 lbs   BMI  27.12   Systolic 96 94 92  Diastolic 68 60 67  Pulse 35 40       Assessment and  Interventions   Assessment:  Sinus Bradycardia( no block or pauses on tele), with reasonable and stable blood pressure since evalulated by cardiology earlier  Plan: Continue plan as outlined by cardiology Ordering Mg and K and will replenish to Mg over 2 and K over 4 if needed X   Addendum: K 3.7 and Mg 1.9 Ordering KCl 40 mg p.o. x 1 and magnesium  800 mg p.o. x 1

## 2023-11-07 NOTE — ED Notes (Signed)
 Pt home medication Mavyret stored in pharmacy.

## 2023-11-07 NOTE — Assessment & Plan Note (Signed)
-   This is likely prerenal due to long depletion and dehydration. - He will be hydrated with IV normal saline. - Will follow BMP. - Will avoid nephrotoxins.

## 2023-11-07 NOTE — Progress Notes (Signed)
  Echocardiogram 2D Echocardiogram has been performed.  Dione Franks 11/07/2023, 5:44 PM

## 2023-11-07 NOTE — ED Notes (Signed)
 Pt sitting up eating crackers and sprite

## 2023-11-07 NOTE — Procedures (Signed)
 Pt getting moved to a room at this time.  Will do eeg 5/22

## 2023-11-07 NOTE — ED Notes (Signed)
 Daptomycin stopped immediately after receiving call from [facility].

## 2023-11-08 ENCOUNTER — Inpatient Hospital Stay: Payer: MEDICAID

## 2023-11-08 DIAGNOSIS — R569 Unspecified convulsions: Secondary | ICD-10-CM | POA: Diagnosis not present

## 2023-11-08 DIAGNOSIS — R400 Somnolence: Secondary | ICD-10-CM | POA: Diagnosis not present

## 2023-11-08 DIAGNOSIS — R4182 Altered mental status, unspecified: Secondary | ICD-10-CM

## 2023-11-08 LAB — CBC
HCT: 27.4 % — ABNORMAL LOW (ref 39.0–52.0)
Hemoglobin: 8.7 g/dL — ABNORMAL LOW (ref 13.0–17.0)
MCH: 23.6 pg — ABNORMAL LOW (ref 26.0–34.0)
MCHC: 31.8 g/dL (ref 30.0–36.0)
MCV: 74.3 fL — ABNORMAL LOW (ref 80.0–100.0)
Platelets: 194 10*3/uL (ref 150–400)
RBC: 3.69 MIL/uL — ABNORMAL LOW (ref 4.22–5.81)
RDW: 15 % (ref 11.5–15.5)
WBC: 5.4 10*3/uL (ref 4.0–10.5)
nRBC: 0 % (ref 0.0–0.2)

## 2023-11-08 LAB — BASIC METABOLIC PANEL WITH GFR
Anion gap: 6 (ref 5–15)
BUN: 23 mg/dL — ABNORMAL HIGH (ref 6–20)
CO2: 23 mmol/L (ref 22–32)
Calcium: 8.5 mg/dL — ABNORMAL LOW (ref 8.9–10.3)
Chloride: 109 mmol/L (ref 98–111)
Creatinine, Ser: 0.93 mg/dL (ref 0.61–1.24)
GFR, Estimated: >60 mL/min (ref >60)
Glucose, Bld: 130 mg/dL — ABNORMAL HIGH (ref 70–99)
Potassium: 4.1 mmol/L (ref 3.5–5.1)
Sodium: 138 mmol/L (ref 135–145)

## 2023-11-08 LAB — ECHOCARDIOGRAM COMPLETE
Area-P 1/2: 2.32 cm2
Height: 72 in
S' Lateral: 3.3 cm
Single Plane A4C EF: 56.1 %
Weight: 3200 [oz_av]

## 2023-11-08 LAB — CORTISOL-AM, BLOOD: Cortisol - AM: 11.7 ug/dL (ref 6.7–22.6)

## 2023-11-08 MED ORDER — METHADONE HCL 10 MG/ML PO CONC
130.0000 mg | Freq: Every day | ORAL | Status: DC
  Administered 2023-11-08: 130 mg via ORAL
  Filled 2023-11-08: qty 15

## 2023-11-08 MED ORDER — METHADONE HCL 5 MG/5ML PO SOLN: 130.0000 mg | Freq: Every day | ORAL | Status: DC

## 2023-11-08 NOTE — Progress Notes (Signed)
 Right upper PICC line dressing change today. Dressing clean, dry, intact.

## 2023-11-08 NOTE — Procedures (Signed)
 Pt refused eeg until he gets methadone

## 2023-11-08 NOTE — Progress Notes (Signed)
 [facility] CLINIC CARDIOLOGY PROGRESS NOTE       Patient ID: [name] MRN: [id number] DOB/AGE: 07-24-1990 33 y.o.  Admit date: 11/06/2023 Referring Physician Dr. Sari Cunning Primary Physician Pcp, No Primary Cardiologist None Reason for Consultation Bradycardia  HPI: PISTOL KESSENICH is a 33 y.o. male  with a past medical history of polysubstance abuse (on methadone), chronic hepatitis C, and no known cardiac history who presented to the ED on 11/06/2023 for altered mental status with generalized weakness. Patient uses marijuana and methamphetamines. EKG in ED showed sinus bradycardia with rate 44 bpm. Cardiology was consulted for further evaluation.   Interval History: -Patient seen and examined this AM and laying comfortably  in hospital bed  Patient states he feels fine and without chest pain, lightheadedness, dizziness or SOB.  -Patients BP stable and HR  slow. Overnight Tele showed no  significant events. No evidence of high grade AVB or significant pauses.   -Patient remains on room air with stable SpO2.    Review of systems complete and found to be negative unless listed above    Past Medical History:  Diagnosis Date   AKI (acute kidney injury) (HCC)    01/2012 resolved   AMS (altered mental status) 10/01/2021   Elevated CK 01/29/2012   Hypokalemia 01/30/2012   Hyponatremia 01/29/2012   Maculopapular rash, generalized 01/29/2012   Rhabdomyolysis 10/01/2021    Past Surgical History:  Procedure Laterality Date   APPENDECTOMY  2007   ruptured    HIP SURGERY      Medications Prior to Admission  Medication Sig Dispense Refill Last Dose/Taking   aspirin EC 81 MG tablet Take 81 mg by mouth 2 (two) times daily.   Unknown   DAPTOMYCIN IV Inject 650 mg into the vein daily.  13 ml IVP   Unknown   ferrous sulfate 325 (65 FE) MG EC tablet Take 1 tablet by mouth daily with breakfast.   Unknown   gabapentin (NEURONTIN) 300 MG capsule Take 1 capsule by mouth 3 (three) times daily.   Unknown    Glecaprevir-Pibrentasvir (MAVYRET) 100-40 MG TABS Take 3 tablets by mouth daily. Take 3 tablets by mouth once daily Take with food. Do not miss or skip doses. If a dose is missed and it is: more than 18 hours from the usual time that MAVYRET should have been taken- do not administer the missed dose and take the next dose at the usual time. less than 18 hours from the usual time that MAVYRET should have been taken - administer the dose as soon as possible and then to take the next dose at the usual time.   Unknown   levofloxacin (LEVAQUIN) 750 MG tablet Take 750 mg by mouth daily.   Unknown   methadone (DOLOPHINE) 1 MG/1ML solution Take 140 mg by mouth daily.   Unknown   naloxone  (NARCAN ) nasal spray 4 mg/0.1 mL For suspected opioid overdose, spray one device into the nostril, then call 911.  Repeat a second dose into other nostril if not response 2-3 minutes after first dose. 2 each 1 Unknown   senna-docusate (SENOKOT-S) 8.6-50 MG tablet Take 2 tablets by mouth daily. (Patient taking differently: Take 2 tablets by mouth 2 (two) times daily.) 60 tablet 1 Unknown   fentaNYL  (DURAGESIC ) 25 MCG/HR Place 1 patch onto the skin every 3 (three) days. (Patient not taking: Reported on 11/06/2023) 5 patch 0 Not Taking   naproxen  (NAPROSYN ) 500 MG tablet Take 1 tablet (500 mg total) by mouth  2 (two) times daily with a meal. (Patient not taking: Reported on 11/06/2023) 60 tablet 1 Not Taking   oxyCODONE  (ROXICODONE ) 15 MG immediate release tablet Take 1 tablet (15 mg total) by mouth every 6 (six) hours as needed for breakthrough pain. (Patient not taking: Reported on 11/06/2023) 30 tablet 0 Not Taking   pantoprazole  (PROTONIX ) 40 MG tablet Take 1 tablet (40 mg total) by mouth 2 (two) times daily. (Patient not taking: Reported on 11/06/2023) 60 tablet 1 Not Taking   polyethylene glycol (MIRALAX  / GLYCOLAX ) 17 g packet Take 17 g by mouth daily. (Patient not taking: Reported on 11/06/2023) 30 each 1 Not Taking   Social  History   Socioeconomic History   Marital status: Single    Spouse name: Not on file   Number of children: Not on file   Years of education: Not on file   Highest education level: Not on file  Occupational History   Not on file  Tobacco Use   Smoking status: Every Day    Current packs/day: 0.50    Average packs/day: 0.5 packs/day for 8.0 years (4.0 ttl pk-yrs)    Types: Cigarettes   Smokeless tobacco: Current    Types: Chew  Substance and Sexual Activity   Alcohol use: Yes    Alcohol/week: 1.0 - 2.0 standard drink of alcohol    Types: 1 - 2 Cans of beer per week   Drug use: Yes    Frequency: 7.0 times per week    Types: Marijuana   Sexual activity: Yes  Other Topics Concern   Not on file  Social History Narrative   Lives in [place]. Lives at parents house. Finished 9th grade. 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     No insurance.    Social Drivers of Corporate investment banker Strain: High Risk (10/09/2023)   Received from [facility]   Overall Financial Resource Strain (CARDIA)    Difficulty of Paying Living Expenses: Hard  Food Insecurity: No Food Insecurity (11/07/2023)   Hunger Vital Sign    Worried About Running Out of Food in the Last Year: Never true    Ran Out of Food in the Last Year: Never true  Recent Concern: Food Insecurity - Food Insecurity Present (10/09/2023)   Received from [facility]   Hunger Vital Sign    Worried About Running Out of Food in the Last Year: Sometimes true    Ran Out of Food in the Last Year: Sometimes true  Transportation Needs: Unmet Transportation Needs (11/07/2023)   PRAPARE - Administrator, Civil Service (Medical): Yes    Lack of Transportation (Non-Medical): Yes  Physical Activity: Not on file  Stress: Not on file  Social Connections: Unknown (11/07/2023)   Social Connection and Isolation Panel [NHANES]    Frequency of Communication with Friends and Family: Not on file     Frequency of Social Gatherings with Friends and Family: Not on file    Attends Religious Services: Not on file    Active Member of Clubs or Organizations: Not on file    Attends Banker Meetings: Not on file    Marital Status: Never married  Intimate Partner Violence: Not At Risk (11/07/2023)   Humiliation, Afraid, Rape, and Kick questionnaire    Fear of Current or Ex-Partner: No    Emotionally Abused: No    Physically Abused: No    Sexually Abused: No    Family History  Problem Relation Age of Onset   Hypertension Father      Vitals:   11/07/23 2347 11/08/23 0313 11/08/23 0812 11/08/23 0900  BP: 104/72 108/79 116/76 115/74  Pulse: (!) 44 85 (!) 39 (!) 44  Resp: 16 16  16   Temp: 97.8 F (36.6 C) 97.6 F (36.4 C) 97.9 F (36.6 C) 97.9 F (36.6 C)  TempSrc: Oral Oral Oral Oral  SpO2: 99% 94% 97% 97%  Weight:      Height:        PHYSICAL EXAM General: chronically ill appear male, well nourished, in no acute distress. HEENT: Normocephalic and atraumatic. Neck: No JVD.   Lungs: Normal respiratory effort on . Clear bilaterally to auscultation. No wheezes, crackles, rhonchi.  Heart: HRR, slow rates. Normal S1 and S2 without gallops or murmurs.  Abdomen: Non-distended appearing.  Msk: Normal strength and tone for age. Extremities: Warm and well perfused. No clubbing, cyanosis. No edema.   Labs: Basic Metabolic Panel: Recent Labs    11/07/23 0429 11/07/23 2210 11/08/23 0819  NA 139 138 138  K 3.9 3.7 4.1  CL 109 108 109  CO2 23 25 23   GLUCOSE 79 100* 130*  BUN 35* 29* 23*  CREATININE 0.97 0.96 0.93  CALCIUM 8.6* 8.6* 8.5*  MG 2.0 1.9  --    Liver Function Tests: Recent Labs    11/06/23 1220  AST 42*  ALT 33  ALKPHOS 75  BILITOT 0.6  PROT 8.5*  ALBUMIN 4.1   No results for input(s): "LIPASE", "AMYLASE" in the last 72 hours. CBC: Recent Labs    11/06/23 1220 11/07/23 0429 11/08/23 0819  WBC 8.0 5.9 5.4  NEUTROABS 5.5  --   --   HGB  10.7* 8.8* 8.7*  HCT 34.1* 28.0* 27.4*  MCV 75.6* 76.7* 74.3*  PLT 285 194 194   Cardiac Enzymes: Recent Labs    11/07/23 0429  CKTOTAL 856*   BNP: No results for input(s): "BNP" in the last 72 hours. D-Dimer: No results for input(s): "DDIMER" in the last 72 hours. Hemoglobin A1C: No results for input(s): "HGBA1C" in the last 72 hours. Fasting Lipid Panel: No results for input(s): "CHOL", "HDL", "LDLCALC", "TRIG", "CHOLHDL", "LDLDIRECT" in the last 72 hours. Thyroid Function Tests: Recent Labs    11/07/23 0429  TSH 1.452   Anemia Panel: Recent Labs    11/06/23 1523  VITAMINB12 385     Radiology: MR BRAIN WO CONTRAST Result Date: 11/07/2023 CLINICAL DATA:  Initial evaluation for acute neuro deficit, stroke. EXAM: MRI HEAD WITHOUT CONTRAST TECHNIQUE: Multiplanar, multiecho pulse sequences of the brain and surrounding structures were obtained without intravenous contrast. COMPARISON:  CT from 11/06/2023 FINDINGS: Brain: Examination technically limited as the patient was unable to tolerate the full length of the exam. Additionally, provided images are intermittently degraded by motion, particularly the FLAIR sequence. Cerebral volume within normal limits. No focal parenchymal signal abnormality or significant cerebral white matter disease. No evidence for acute or subacute infarct. No recent chronic cortical infarction. Evaluation for intracranial blood products limited given lack of SWI sequence. No mass lesion, midline shift or mass effect. No hydrocephalus or extra-axial fluid collection. Pituitary gland within normal limits. Vascular: Major intracranial vascular flow voids are maintained. Skull and upper cervical spine: Craniocervical junction within normal limits. Bone marrow signal intensity grossly normal. No scalp soft tissue abnormality. Sinuses/Orbits: Globes orbital soft tissues within normal limits. Mild mucosal thickening noted about the ethmoidal air cells and maxillary  sinuses. Paranasal sinuses are  otherwise clear. No mastoid effusion. Other: None. IMPRESSION: 1. Technically limited truncated exam due to the patient's inability to tolerate the full length of the study. 2. Grossly normal brain MRI. No acute intracranial abnormality identified. Electronically Signed   By: Virgia Griffins M.D.   On: 11/07/2023 04:17   DG Hip Unilat With Pelvis 2-3 Views Left Result Date: 11/06/2023 CLINICAL DATA:  Left hip pain. EXAM: DG HIP (WITH OR WITHOUT PELVIS) 2-3V LEFT COMPARISON:  January 08, 2023. FINDINGS: Status post left total hip arthroplasty as well as surgical internal fixation of old left acetabular fracture. No acute fracture or dislocation is noted. IMPRESSION: Postsurgical changes as noted above.  No acute abnormality seen. Electronically Signed   By: Rosalene Colon M.D.   On: 11/06/2023 16:20   CT Head Wo Contrast Result Date: 11/06/2023 CLINICAL DATA:  Provided history: Mental status change, unknown cause. Additional history provided: Generalized weakness, altered mental status. EXAM: CT HEAD WITHOUT CONTRAST TECHNIQUE: Contiguous axial images were obtained from the base of the skull through the vertex without intravenous contrast. RADIATION DOSE REDUCTION: This exam was performed according to the departmental dose-optimization program which includes automated exposure control, adjustment of the mA and/or kV according to patient size and/or use of iterative reconstruction technique. COMPARISON:  Brain MRI 01/08/2023.  Head CT 01/08/2023. FINDINGS: Brain: There is no acute intracranial hemorrhage. No demarcated cortical infarct. No extra-axial fluid collection. No evidence of an intracranial mass. No midline shift. Vascular: No hyperdense vessel. Skull: No calvarial fracture or aggressive osseous lesion. Sinuses/Orbits: No mass or acute finding within the imaged orbits. Mild mucosal thickening, and small-volume secretions, within the right maxillary sinus at the imaged  levels. Mild mucosal thickening within the left maxillary sinus at the imaged levels. IMPRESSION: 1. No acute intracranial finding. 2. Mild maxillary sinus disease at the imaged levels. Correlate for signs/symptoms of acute sinusitis. Electronically Signed   By: Bascom Lily D.O.   On: 11/06/2023 15:12    ECHO as above  TELEMETRY reviewed by me 11/08/2023: sinus bradycardia, rate 40s (no evidence of high grade AVB or significant pauses).  EKG reviewed by me: sinus bradycardia, rate 44 bpm  Data reviewed by me 11/08/2023: last 24h vitals tele labs imaging I/O ED provider note, admission H&P.  Principal Problem:   Altered mental status Active Problems:   Tobacco abuse   Methamphetamine abuse (HCC)   Alcohol abuse   AKI (acute kidney injury) (HCC)   GERD without esophagitis   Peripheral neuropathy   Methadone use   Mild fentanyl  abuse (HCC)    ASSESSMENT AND PLAN:  [name] is a 33 y.o. male  with a past medical history of polysubstance abuse (on methadone), chronic hepatitis C, and no known cardiac history who presented to the ED on 11/06/2023 for altered mental status with generalized weakness. Patient uses marijuana and methamphetamines. EKG in ED showed sinus bradycardia with rate 44 bpm. Cardiology was consulted for further evaluation.   # Altered Mental Status # Polysubstance abuse # Sinus bradycardia Patient with no known cardiac hx and hx of polysubstance abuse presents to ED with AMS and denies chest pain or palpitations. UDS positive for methadone. CT head and MRI brain with no acute intracranial finding. EKG in ED with sinus bradycardia, rate 44 bpm. Patient received 2 doses of IV atropine. Echo this admission pEF, no RWMA. Per tele patient has remains in sinus bradycardia with no evidence of high grade AVB or significant pauses.  -Monitor and replenish  electrolytes for a goal K >4, Mag >2  -Avoid AVN blockers has patient has remained bradycardic since admission.    Cardiology will sign off. Please haiku with questions or re-engage if needed.   This patient's plan of care was discussed and created with Dr. Beau Bound and he is in agreement.  Signed: Creighton Doffing, PA-C  11/08/2023, 11:58 AM [facility] Cardiology

## 2023-11-08 NOTE — Progress Notes (Signed)
 Nursing staff confirmed discharge patient with functioning PICC line with Dr Sari Cunning.  Patient instructed to follow up with [facility] as they inserted PICC and follow their plan of care.

## 2023-11-08 NOTE — Progress Notes (Signed)
 Eed done

## 2023-11-08 NOTE — Discharge Summary (Signed)
 [name]:[id number] DOB: [birth year] DOA: 11/06/2023  PCP: Pcp, No  Admit date: 11/06/2023 Discharge date: 11/08/2023  Time spent: 35 minutes  Recommendations for Outpatient Follow-up:  [facility] f/u as scheduled [facility] f/u as scheduled Would advise gradual dose reduction of methadone, or transition to alternate agent such as suboxone     Discharge Diagnoses:  Principal Problem:   Altered mental status Active Problems:   AKI (acute kidney injury) (HCC)   Tobacco abuse   Methamphetamine abuse (HCC)   Alcohol abuse   GERD without esophagitis   Peripheral neuropathy   Methadone use   Mild fentanyl  abuse (HCC)   Discharge Condition: improved  Diet recommendation: regular  Filed Weights   11/06/23 1216 11/07/23 1640  Weight: 90.7 kg 90.7 kg    History of present illness:  From admission h and p [name] is a 33 y.o. Caucasian male with medical history significant for peripheral neuropathy, substance abuse cleaning alcohol, tobacco marijuana and methamphetamine, chronic hepatitis C and GERD, who presented to the emergency room with acute onset of altered mental status with generalized weakness.  He stated that he was not having much energy.  He was having left hip pain.  He denied any chest pain or palpitations.  No cough or wheezing or dyspnea.  No fever or chills.  No headache or dizziness.  She admitted to constipation.  He was fairly somnolent but arousable.  No dysuria, oliguria or hematuria or flank pain.  He admitted to using marijuana yesterday and methamphetamine about a couple months ago.  His urine drug screen was positive for methadone and cannabinoids.   Hospital Course:  Patient presents with encephalopathy. Found to have mild AKI. Outpatient methadone dose recently increase to account for post-surgery pain. Here no signs infection of left hip, blood cultures no growth, TTE unremarkable. Did have bradycardia but no high degree av block per cardiology  consult. Methadone was held, AKI resolved, and mental status returned to baseline, further evidence home methadone to blame. Advise gradual dose reduction of outpatient methadone or transition to alternate agent. Set to finish antitibiotics (levofloxacin and daptomycin) for left hip infection on 5/21, advised close f/u with ID and orthopedics as previously advised.   Procedures: none   Consultations: cardiology  Discharge Exam: Vitals:   11/08/23 0900 11/08/23 1212  BP: 115/74 (!) 131/91  Pulse: (!) 44 (!) 41  Resp: 16 15  Temp: 97.9 F (36.6 C) 98 F (36.7 C)  SpO2: 97% 100%    General: disheveled, NAD Cardiovascular: rrr Respiratory: ctab  Discharge Instructions   Discharge Instructions     Diet general   Complete by: As directed    Increase activity slowly   Complete by: As directed       Allergies as of 11/08/2023   No Known Allergies      Medication List     STOP taking these medications    DAPTOMYCIN IV   fentaNYL  25 MCG/HR Commonly known as: DURAGESIC    levofloxacin 750 MG tablet Commonly known as: LEVAQUIN   naproxen  500 MG tablet Commonly known as: NAPROSYN    oxyCODONE  15 MG immediate release tablet Commonly known as: ROXICODONE        TAKE these medications    aspirin EC 81 MG tablet Take 81 mg by mouth 2 (two) times daily.   ferrous sulfate 325 (65 FE) MG EC tablet Take 1 tablet by mouth daily with breakfast.   gabapentin 300 MG capsule Commonly known as: NEURONTIN  Take 1 capsule by mouth 3 (three) times daily.   Mavyret 100-40 MG Tabs Generic drug: Glecaprevir-Pibrentasvir Take 3 tablets by mouth daily. Take 3 tablets by mouth once daily Take with food. Do not miss or skip doses. If a dose is missed and it is: more than 18 hours from the usual time that MAVYRET should have been taken- do not administer the missed dose and take the next dose at the usual time. less than 18 hours from the usual time that MAVYRET should have been  taken - administer the dose as soon as possible and then to take the next dose at the usual time.   methadone 1 MG/1ML solution Commonly known as: DOLOPHINE Take 140 mg by mouth daily.   naloxone  4 MG/0.1ML Liqd nasal spray kit Commonly known as: NARCAN  For suspected opioid overdose, spray one device into the nostril, then call 911.  Repeat a second dose into other nostril if not response 2-3 minutes after first dose.   pantoprazole  40 MG tablet Commonly known as: PROTONIX  Take 1 tablet (40 mg total) by mouth 2 (two) times daily.   polyethylene glycol 17 g packet Commonly known as: MIRALAX  / GLYCOLAX  Take 17 g by mouth daily.   senna-docusate 8.6-50 MG tablet Commonly known as: Senokot-S Take 2 tablets by mouth daily. What changed: when to take this       No Known Allergies  Follow-up Information     Custovic, Sabina, DO. Go in 1 week(s).   Specialty: Cardiology Contact information: [address] [place] [address] [phone]         your methadone provider Follow up.                   The results of significant diagnostics from this hospitalization (including imaging, microbiology, ancillary and laboratory) are listed below for reference.    Significant Diagnostic Studies: ECHOCARDIOGRAM COMPLETE Result Date: 11/08/2023    ECHOCARDIOGRAM REPORT   Patient Name:   [name] Date of Exam: 11/07/2023 Medical Rec #:  [id number]    Height:       71.0 in Accession #:    [id number]   Weight:       200.0 lb Date of Birth:  Oct 29, 1990    BSA:          2.109 m Patient Age:    33 years     BP:           94/60 mmHg Patient Gender: M            HR:           42 bpm. Exam Location:  [facility] Procedure: 2D Echo (Both Spectral and Color Flow Doppler were utilized during            procedure). Indications:     endocarditis  History:         Patient has no prior history of Echocardiogram examinations.                  Hepatitis C; Risk Factors:Current Smoker and  polysubstance                  abuse.  Sonographer:     Dione Franks RDCS Referring Phys:  [id number] Creighton Doffing Diagnosing Phys: Antonette Batters MD IMPRESSIONS  1. Left ventricular ejection fraction, by estimation, is 55 to 60%. The left ventricle has normal function. The left ventricle has no regional wall motion abnormalities. Left ventricular diastolic  parameters are consistent with Grade II diastolic dysfunction (pseudonormalization).  2. Right ventricular systolic function is normal. The right ventricular size is normal. There is normal pulmonary artery systolic pressure.  3. The mitral valve is normal in structure. No evidence of mitral valve regurgitation.  4. The aortic valve is normal in structure. Aortic valve regurgitation is not visualized. FINDINGS  Left Ventricle: Left ventricular ejection fraction, by estimation, is 55 to 60%. The left ventricle has normal function. The left ventricle has no regional wall motion abnormalities. Strain was performed and the global longitudinal strain is indeterminate. Global longitudinal strain performed but not reported based on interpreter judgement due to suboptimal tracking. The left ventricular internal cavity size was normal in size. There is no left ventricular hypertrophy. Left ventricular diastolic parameters are consistent with Grade II diastolic dysfunction (pseudonormalization). Right Ventricle: The right ventricular size is normal. No increase in right ventricular wall thickness. Right ventricular systolic function is normal. There is normal pulmonary artery systolic pressure. The tricuspid regurgitant velocity is 2.55 m/s, and  with an assumed right atrial pressure of 8 mmHg, the estimated right ventricular systolic pressure is 34.0 mmHg. Left Atrium: Left atrial size was normal in size. Right Atrium: Right atrial size was normal in size. Pericardium: There is no evidence of pericardial effusion. Mitral Valve: The mitral valve is normal in  structure. No evidence of mitral valve regurgitation. Tricuspid Valve: The tricuspid valve is normal in structure. Tricuspid valve regurgitation is trivial. Aortic Valve: The aortic valve is normal in structure. Aortic valve regurgitation is not visualized. Pulmonic Valve: The pulmonic valve was normal in structure. Pulmonic valve regurgitation is not visualized. Aorta: The ascending aorta was not well visualized. IAS/Shunts: No atrial level shunt detected by color flow Doppler. Additional Comments: 3D was performed not requiring image post processing on an independent workstation and was indeterminate.  LEFT VENTRICLE PLAX 2D LVIDd:         4.70 cm      Diastology LVIDs:         3.30 cm      LV e' medial:    13.70 cm/s LV PW:         1.00 cm      LV E/e' medial:  6.9 LV IVS:        0.90 cm      LV e' lateral:   15.40 cm/s LVOT diam:     2.20 cm      LV E/e' lateral: 6.2 LV SV:         76 LV SV Index:   36 LVOT Area:     3.80 cm  LV Volumes (MOD) LV vol d, MOD A4C: 136.0 ml LV vol s, MOD A4C: 59.7 ml LV SV MOD A4C:     136.0 ml RIGHT VENTRICLE             IVC RV Basal diam:  2.90 cm     IVC diam: 2.20 cm RV S prime:     14.90 cm/s TAPSE (M-mode): 2.2 cm LEFT ATRIUM             Index        RIGHT ATRIUM           Index LA diam:        3.90 cm 1.85 cm/m   RA Area:     17.10 cm LA Vol (A2C):   50.0 ml 23.71 ml/m  RA Volume:   47.10 ml  22.34 ml/m LA Vol (  A4C):   46.5 ml 22.05 ml/m LA Biplane Vol: 49.8 ml 23.62 ml/m  AORTIC VALVE LVOT Vmax:   87.50 cm/s LVOT Vmean:  54.500 cm/s LVOT VTI:    0.199 m  AORTA Ao Root diam: 2.70 cm Ao Asc diam:  3.20 cm MITRAL VALVE               TRICUSPID VALVE MV Area (PHT): 2.32 cm    TR Peak grad:   26.0 mmHg MV Decel Time: 327 msec    TR Vmax:        255.00 cm/s MV E velocity: 94.90 cm/s MV A velocity: 43.50 cm/s  SHUNTS MV E/A ratio:  2.18        Systemic VTI:  0.20 m                            Systemic Diam: 2.20 cm Antonette Batters MD Electronically signed by Antonette Batters MD Signature Date/Time: 11/08/2023/1:17:03 PM    Final    MR BRAIN WO CONTRAST Result Date: 11/07/2023 CLINICAL DATA:  Initial evaluation for acute neuro deficit, stroke. EXAM: MRI HEAD WITHOUT CONTRAST TECHNIQUE: Multiplanar, multiecho pulse sequences of the brain and surrounding structures were obtained without intravenous contrast. COMPARISON:  CT from 11/06/2023 FINDINGS: Brain: Examination technically limited as the patient was unable to tolerate the full length of the exam. Additionally, provided images are intermittently degraded by motion, particularly the FLAIR sequence. Cerebral volume within normal limits. No focal parenchymal signal abnormality or significant cerebral white matter disease. No evidence for acute or subacute infarct. No recent chronic cortical infarction. Evaluation for intracranial blood products limited given lack of SWI sequence. No mass lesion, midline shift or mass effect. No hydrocephalus or extra-axial fluid collection. Pituitary gland within normal limits. Vascular: Major intracranial vascular flow voids are maintained. Skull and upper cervical spine: Craniocervical junction within normal limits. Bone marrow signal intensity grossly normal. No scalp soft tissue abnormality. Sinuses/Orbits: Globes orbital soft tissues within normal limits. Mild mucosal thickening noted about the ethmoidal air cells and maxillary sinuses. Paranasal sinuses are otherwise clear. No mastoid effusion. Other: None. IMPRESSION: 1. Technically limited truncated exam due to the patient's inability to tolerate the full length of the study. 2. Grossly normal brain MRI. No acute intracranial abnormality identified. Electronically Signed   By: Virgia Griffins M.D.   On: 11/07/2023 04:17   DG Hip Unilat With Pelvis 2-3 Views Left Result Date: 11/06/2023 CLINICAL DATA:  Left hip pain. EXAM: DG HIP (WITH OR WITHOUT PELVIS) 2-3V LEFT COMPARISON:  January 08, 2023. FINDINGS: Status post left total hip  arthroplasty as well as surgical internal fixation of old left acetabular fracture. No acute fracture or dislocation is noted. IMPRESSION: Postsurgical changes as noted above.  No acute abnormality seen. Electronically Signed   By: Rosalene Colon M.D.   On: 11/06/2023 16:20   CT Head Wo Contrast Result Date: 11/06/2023 CLINICAL DATA:  Provided history: Mental status change, unknown cause. Additional history provided: Generalized weakness, altered mental status. EXAM: CT HEAD WITHOUT CONTRAST TECHNIQUE: Contiguous axial images were obtained from the base of the skull through the vertex without intravenous contrast. RADIATION DOSE REDUCTION: This exam was performed according to the departmental dose-optimization program which includes automated exposure control, adjustment of the mA and/or kV according to patient size and/or use of iterative reconstruction technique. COMPARISON:  Brain MRI 01/08/2023.  Head CT 01/08/2023. FINDINGS: Brain: There is no acute  intracranial hemorrhage. No demarcated cortical infarct. No extra-axial fluid collection. No evidence of an intracranial mass. No midline shift. Vascular: No hyperdense vessel. Skull: No calvarial fracture or aggressive osseous lesion. Sinuses/Orbits: No mass or acute finding within the imaged orbits. Mild mucosal thickening, and small-volume secretions, within the right maxillary sinus at the imaged levels. Mild mucosal thickening within the left maxillary sinus at the imaged levels. IMPRESSION: 1. No acute intracranial finding. 2. Mild maxillary sinus disease at the imaged levels. Correlate for signs/symptoms of acute sinusitis. Electronically Signed   By: Bascom Lily D.O.   On: 11/06/2023 15:12    Microbiology: Recent Results (from the past 240 hours)  Culture, blood (Routine X 2) w Reflex to ID Panel     Status: None (Preliminary result)   Collection Time: 11/07/23  8:48 AM   Specimen: BLOOD  Result Value Ref Range Status   Specimen Description  BLOOD BLOOD LEFT ARM  Final   Special Requests   Final    BOTTLES DRAWN AEROBIC ONLY Blood Culture adequate volume   Culture   Final    NO GROWTH < 24 HOURS Performed at [facility], [address], [place], [place] [address]    Report Status PENDING  Incomplete  Culture, blood (Routine X 2) w Reflex to ID Panel     Status: None (Preliminary result)   Collection Time: 11/07/23  8:48 AM   Specimen: BLOOD  Result Value Ref Range Status   Specimen Description BLOOD BLOOD RIGHT ARM  Final   Special Requests   Final    BOTTLES DRAWN AEROBIC AND ANAEROBIC Blood Culture results may not be optimal due to an inadequate volume of blood received in culture bottles   Culture   Final    NO GROWTH < 24 HOURS Performed at [facility], [address] [place], [place] [address]    Report Status PENDING  Incomplete     Labs: Basic Metabolic Panel: Recent Labs  Lab 11/06/23 1220 11/07/23 0429 11/07/23 2210 11/08/23 0819  NA 141 139 138 138  K 4.7 3.9 3.7 4.1  CL 106 109 108 109  CO2 24 23 25 23   GLUCOSE 102* 79 100* 130*  BUN 42* 35* 29* 23*  CREATININE 1.71* 0.97 0.96 0.93  CALCIUM 9.6 8.6* 8.6* 8.5*  MG  --  2.0 1.9  --    Liver Function Tests: Recent Labs  Lab 11/06/23 1220  AST 42*  ALT 33  ALKPHOS 75  BILITOT 0.6  PROT 8.5*  ALBUMIN 4.1   No results for input(s): "LIPASE", "AMYLASE" in the last 168 hours. Recent Labs  Lab 11/06/23 1220  AMMONIA 32   CBC: Recent Labs  Lab 11/06/23 1220 11/07/23 0429 11/08/23 0819  WBC 8.0 5.9 5.4  NEUTROABS 5.5  --   --   HGB 10.7* 8.8* 8.7*  HCT 34.1* 28.0* 27.4*  MCV 75.6* 76.7* 74.3*  PLT 285 194 194   Cardiac Enzymes: Recent Labs  Lab 11/07/23 0429  CKTOTAL 856*   BNP: BNP (last 3 results) No results for input(s): "BNP" in the last 8760 hours.  ProBNP (last 3 results) No results for input(s): "PROBNP" in the last 8760 hours.  CBG: No results for input(s): "GLUCAP" in the last 168  hours.     Signed:  Raymonde Calico MD.  [facility] 11/08/2023, 1:39 PM

## 2023-11-08 NOTE — Progress Notes (Signed)
 PT Cancellation Note  Patient Details Name: [name] MRN: [id number] DOB: December 07, 1990   Cancelled Treatment:    Reason Eval/Treat Not Completed: Other (comment);Patient's level of consciousness;Pain limiting ability to participate Chart reviewed, attempted to see pt this AM.  Pt struggling to keep eyes open, but able to interact enough to say that L hip pain is too much and he is not able/willing to participate at this time.  Will try back later with hopes that pt is willing to participate.  Darice Edelman, DPT 11/08/2023, 10:20 AM

## 2023-11-08 NOTE — Progress Notes (Signed)
 Home med that was stored in pharmacy returned to pt, cab voucher given to pt as well

## 2023-11-11 NOTE — Procedures (Signed)
 Routine EEG Report  [name] is a 33 y.o. male with a history of altered mental status who is undergoing an EEG to evaluate for seizures.  Report: This EEG was acquired with electrodes placed according to the International 10-20 electrode system (including Fp1, Fp2, F3, F4, C3, C4, P3, P4, O1, O2, T3, T4, T5, T6, A1, A2, Fz, Cz, Pz). The following electrodes were missing or displaced: none.  The occipital dominant rhythm was 8.5 Hz. This activity is reactive to stimulation. Drowsiness was manifested by background fragmentation; deeper stages of sleep were identified by K complexes and sleep spindles. There was no focal slowing. There were no interictal epileptiform discharges. There were no electrographic seizures identified. There was no abnormal response to photic stimulation or hyperventilation.   Impression: This EEG was obtained while awake and asleep and is normal.    Clinical Correlation: Normal EEGs, however, do not rule out epilepsy.  Greg Leaks, MD [facility] [phone]  If 7pm- 7am, please page neurology on call as listed in AMION.

## 2023-11-12 LAB — CULTURE, BLOOD (ROUTINE X 2)
Culture: NO GROWTH
Culture: NO GROWTH
Special Requests: ADEQUATE

## 2024-01-23 IMAGING — CT CT CERVICAL SPINE W/O CM
3 of 4 series · 12 of 33 positions shown, 14 images · non-contrast
Comparison: No priors.

CLINICAL DATA: 30-year-old male with history of drug overdose.
Altered mental status.



[Series 6: sagittal bone · sagittal · 0.29mm/px · 5 of 85 slices shown, 6 images]
[im 29/85  bone]
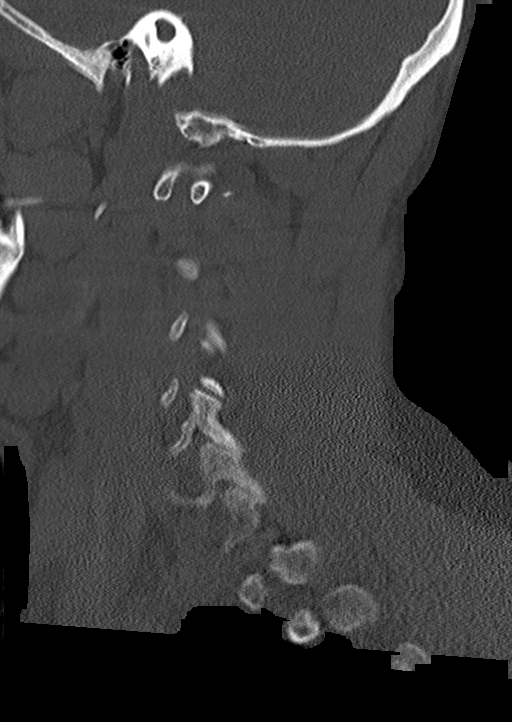
[im 36/85  bone]
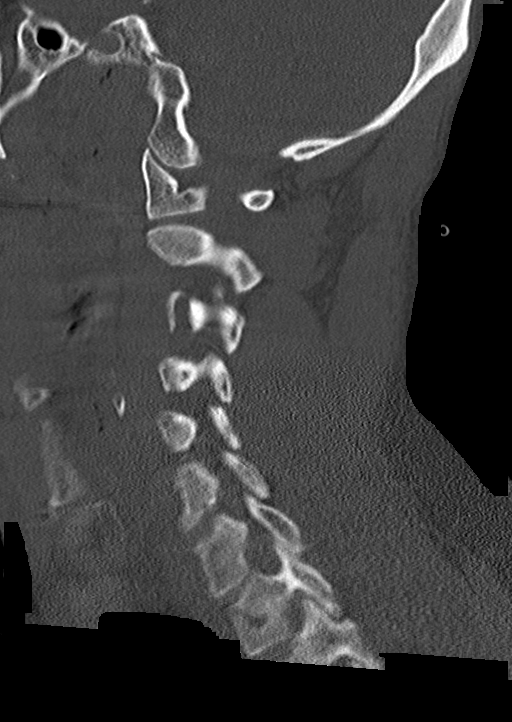
[im 43/85  soft-tissue]
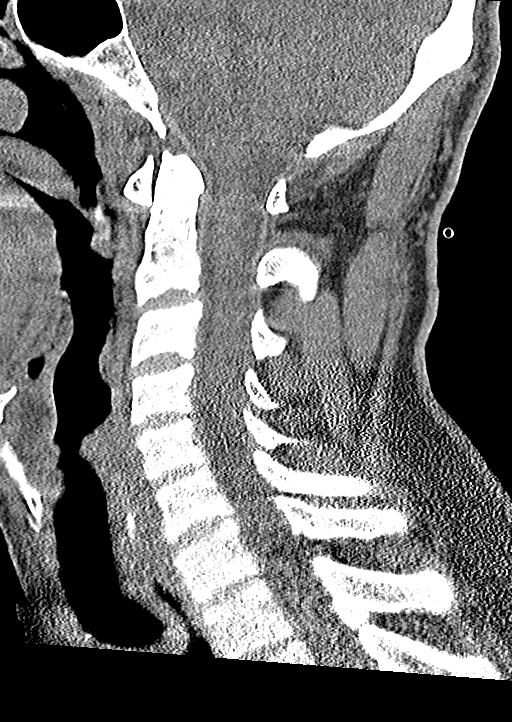
[im 43/85  bone]
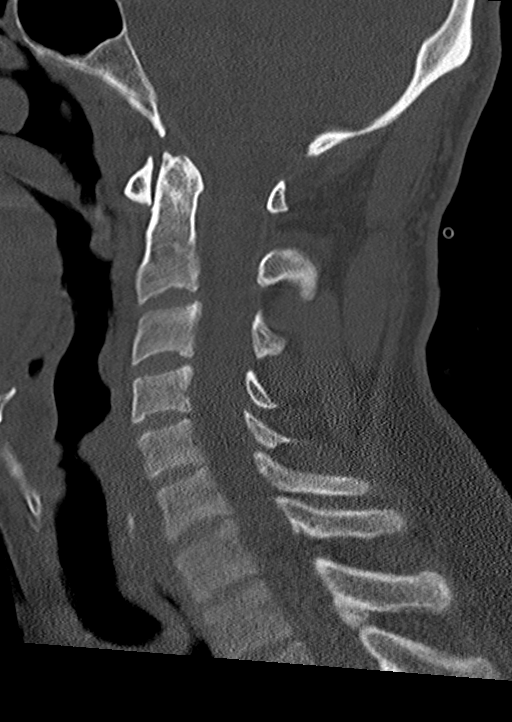
[im 50/85  bone]
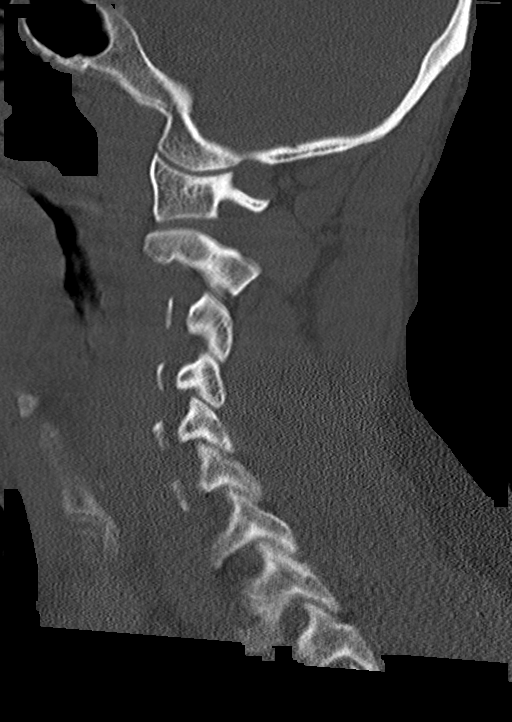
[im 57/85  bone]
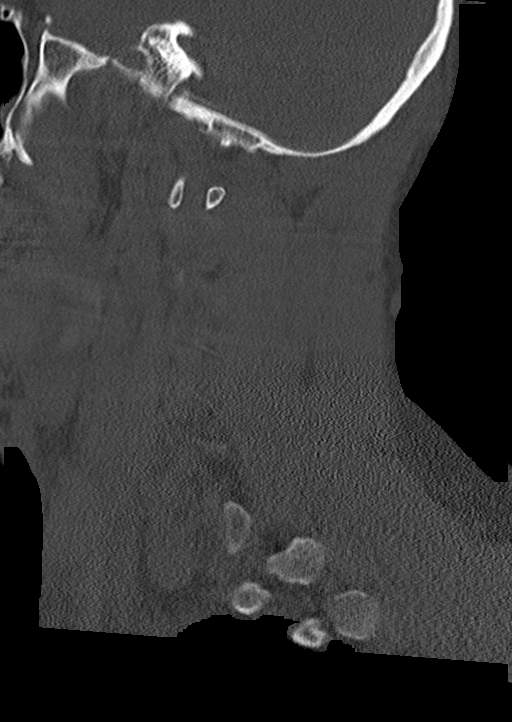

[Series 7: coronal bone · coronal · 0.33mm/px · 3 of 74 slices shown]
[im 15/74  bone]
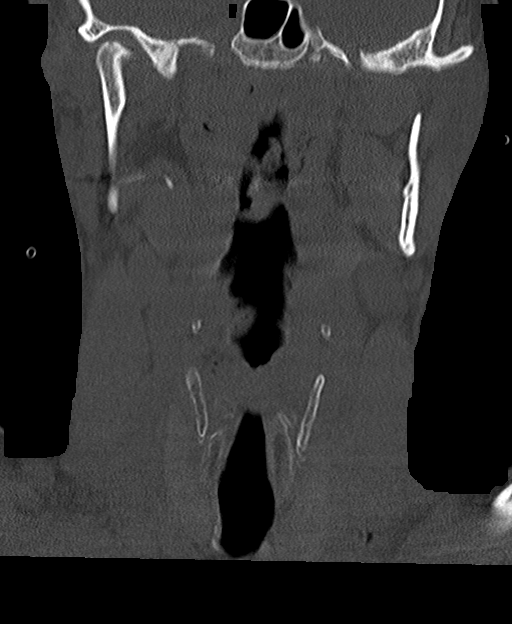
[im 30/74  bone]
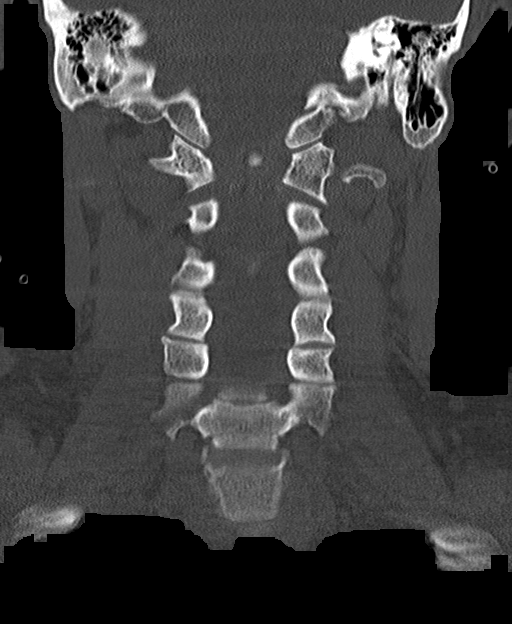
[im 44/74  bone]
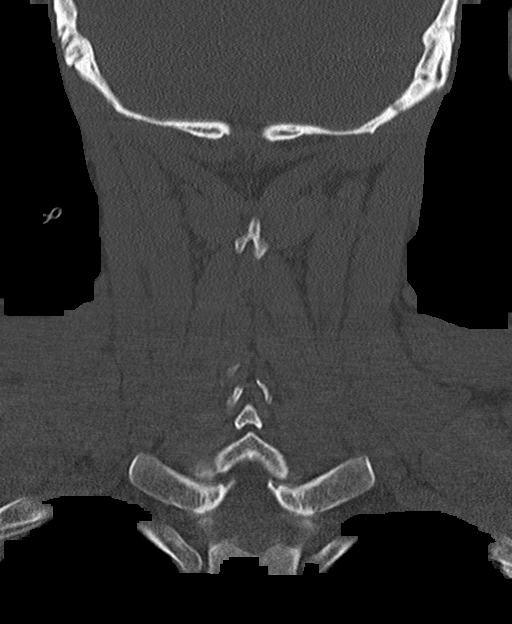

[Series 8: orthogonal bone · axial · 0.29mm/px · z∈[-254,-124]mm · 4 of 99 slices shown, 5 images]
[im 17/99  soft-tissue]
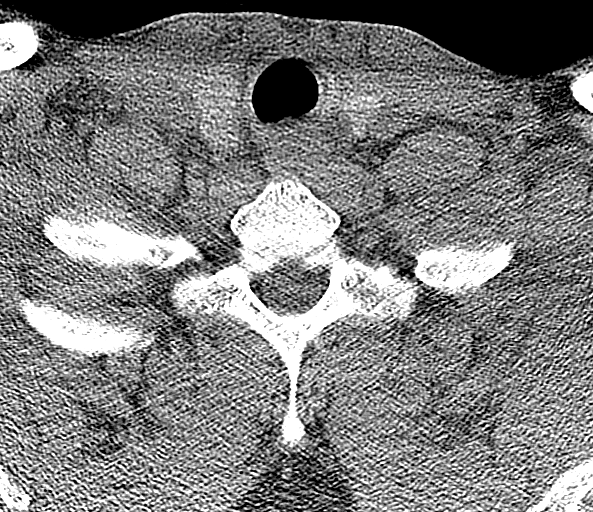
[im 17/99  bone]
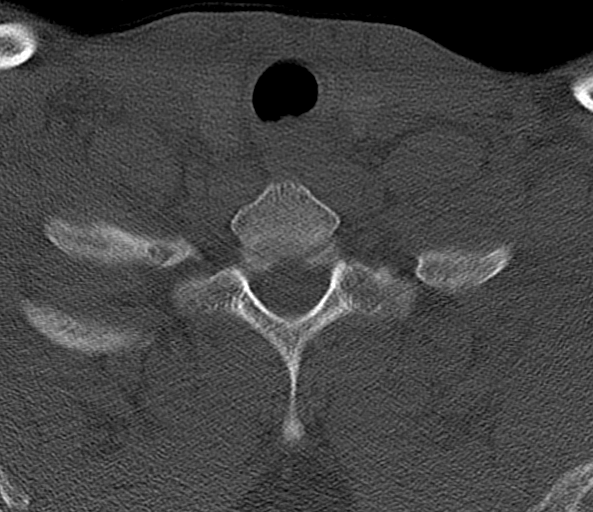
[im 33/99  bone]
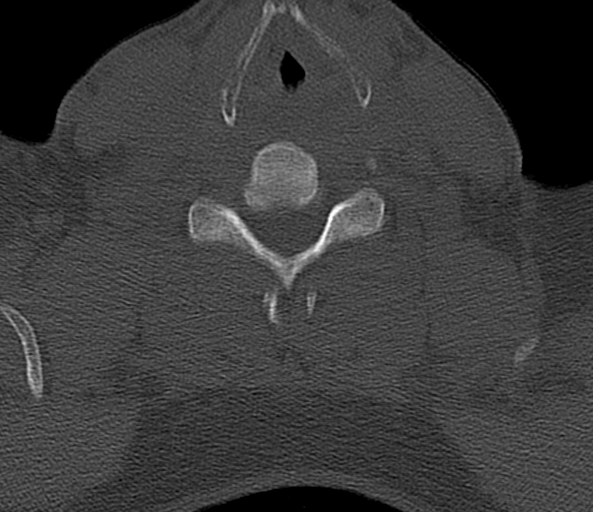
[im 66/99  bone]
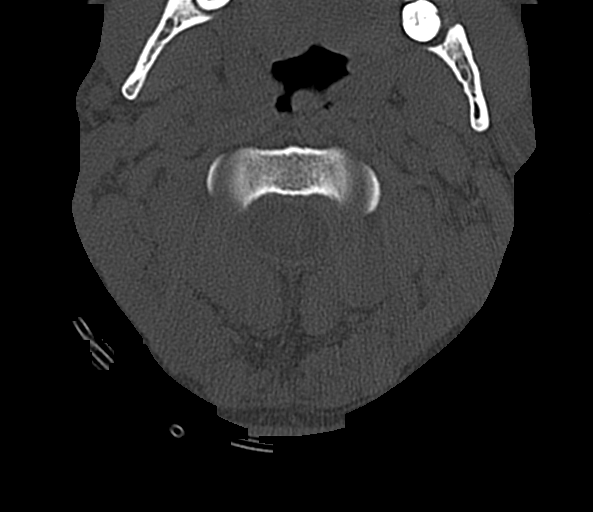
[im 82/99  bone]
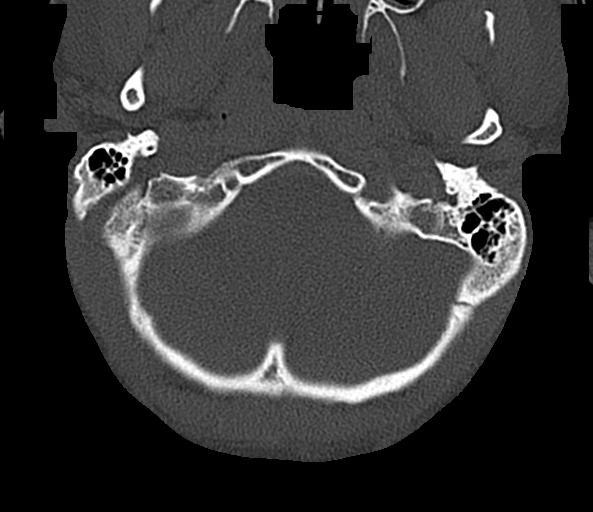

[12 of 33 positions shown; findings below may reference images not displayed]

FINDINGS: CT HEAD FINDINGS

Brain: No evidence of acute infarction, hemorrhage, hydrocephalus,
extra-axial collection or mass lesion/mass effect.

Vascular: No hyperdense vessel or unexpected calcification.

Skull: Normal. Negative for fracture or focal lesion.

Sinuses/Orbits: No acute finding. Mild multifocal mucosal thickening
in the maxillary sinuses bilaterally.

Other: None.

CT CERVICAL SPINE FINDINGS

Alignment: Normal.

Skull base and vertebrae: No acute fracture. No primary bone lesion
or focal pathologic process.

Soft tissues and spinal canal: No prevertebral fluid or swelling. No
visible canal hematoma.

Disc levels: No significant degenerative disc disease or facet
arthropathy.

Upper chest: Negative.

Other: None.
IMPRESSION: 1. No acute intracranial abnormalities. The appearance of the brain
is normal.
2. No acute abnormality of the cervical spine.

## 2024-01-23 IMAGING — CT CT HEAD W/O CM
4 series · 16 of 47 positions shown, 18 images · non-contrast
Comparison: No priors.

CLINICAL DATA: 30-year-old male with history of drug overdose.
Altered mental status.



[Series 2: head wo · axial · 0.45mm/px · z∈[-86,+34]mm · 7 of 33 slices shown, 9 images]
[im 5/33  brain]
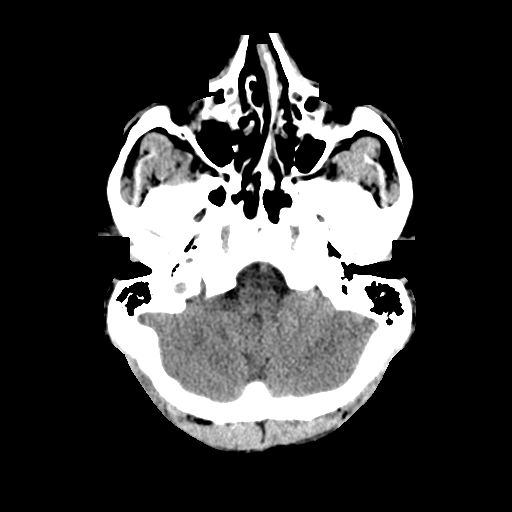
[im 5/33  bone]
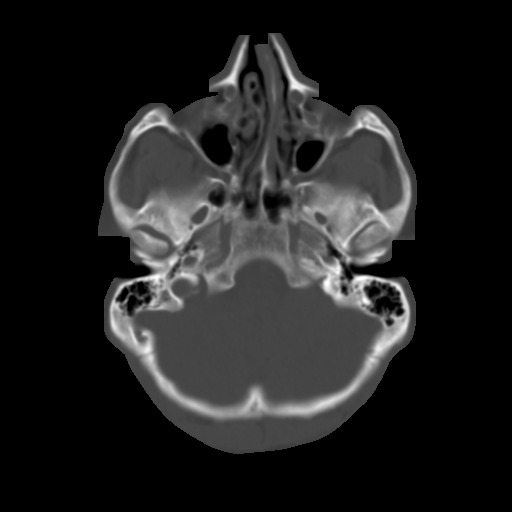
[im 9/33  brain]
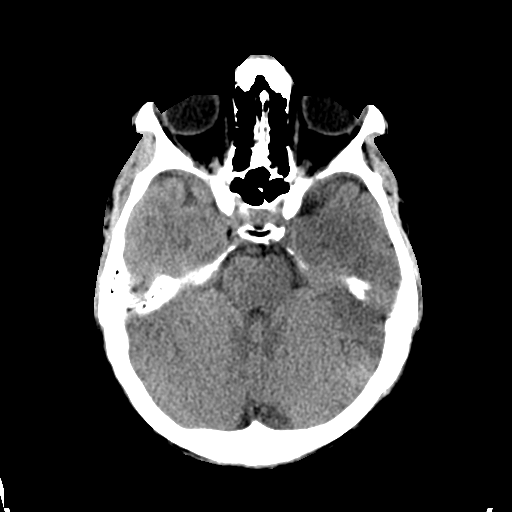
[im 13/33  brain]
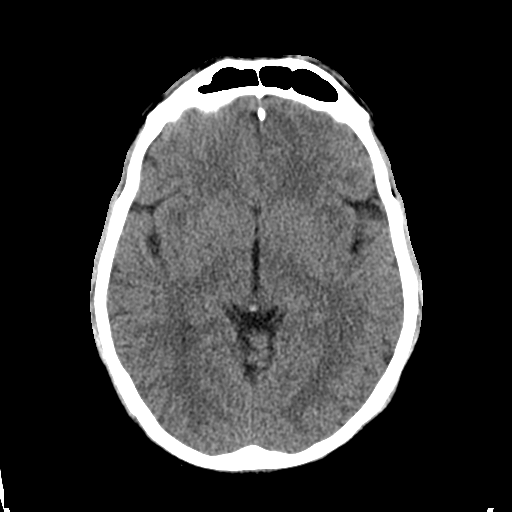
[im 17/33  brain]
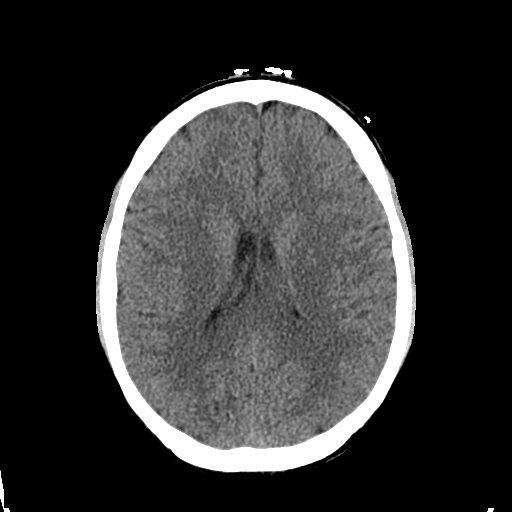
[im 21/33  brain]
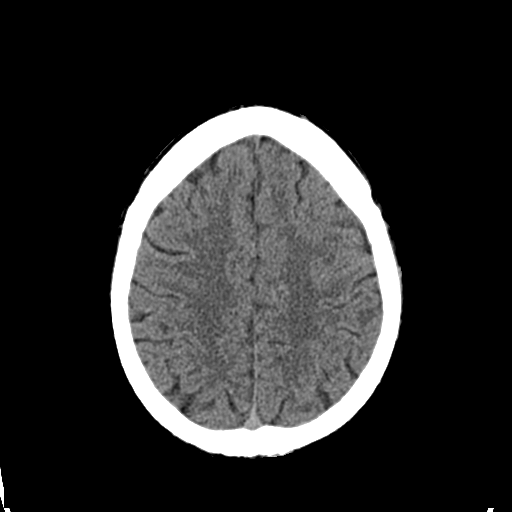
[im 21/33  bone]
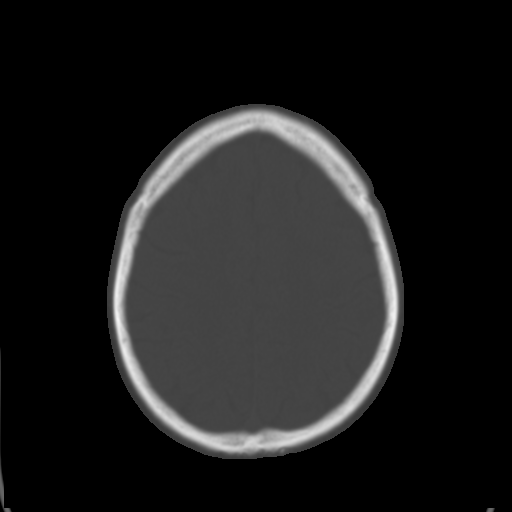
[im 25/33  brain]
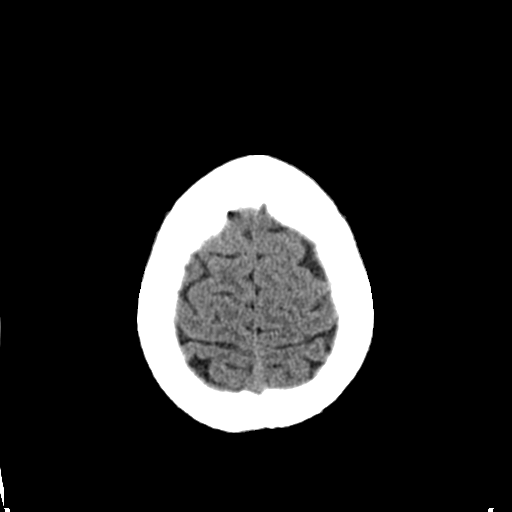
[im 29/33  brain]
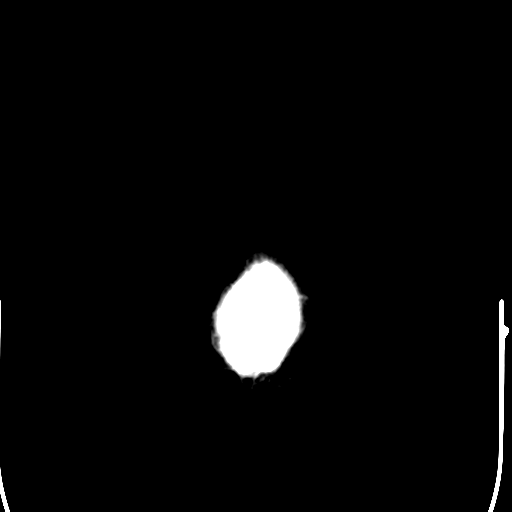

[Series 3: head bone · axial · 0.45mm/px · z∈[-90,-58]mm · 3 of 81 slices shown]
[im 9/81  bone]
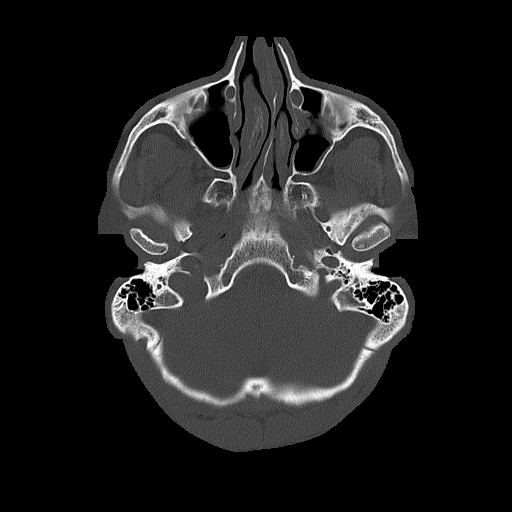
[im 17/81  bone]
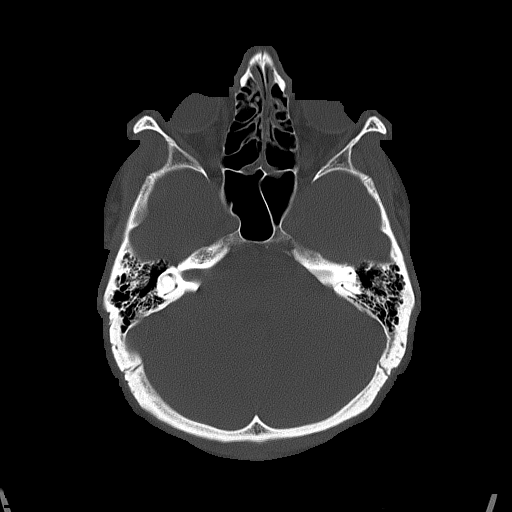
[im 25/81  bone]
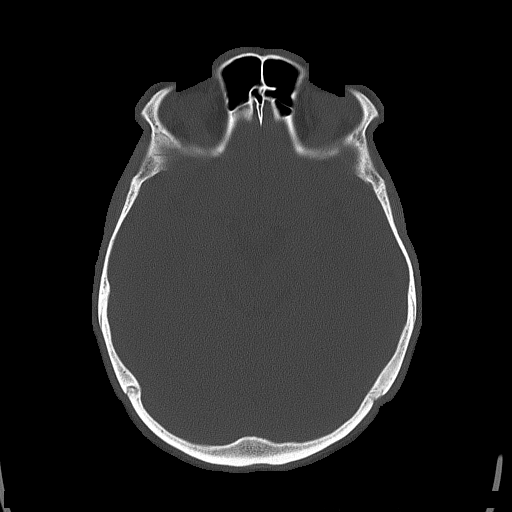

[Series 4: coronal soft tissue · coronal · 0.30mm/px · 3 of 68 slices shown]
[im 23/68  brain]
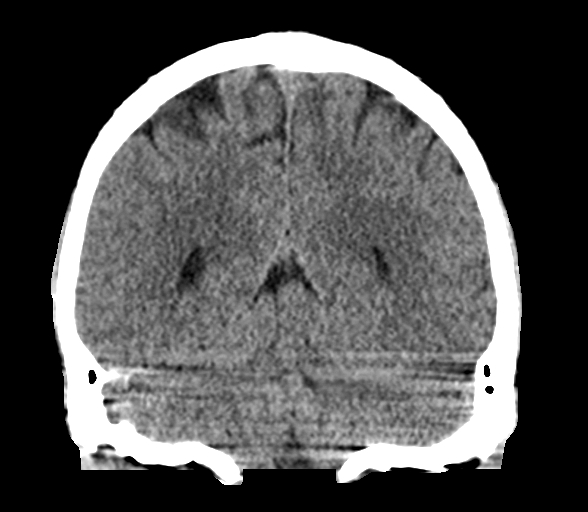
[im 30/68  brain]
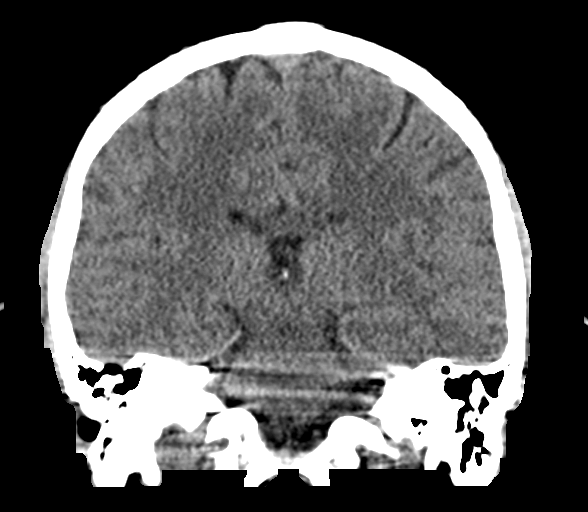
[im 38/68  brain]
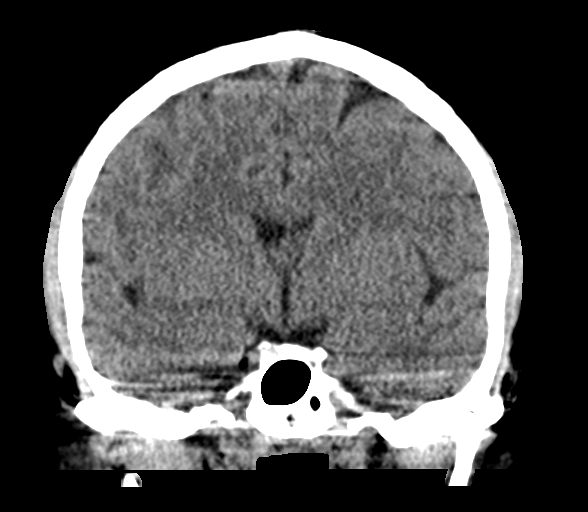

[Series 5: sagittal soft tissue · sagittal · 0.30mm/px · 3 of 59 slices shown]
[im 20/59  brain]
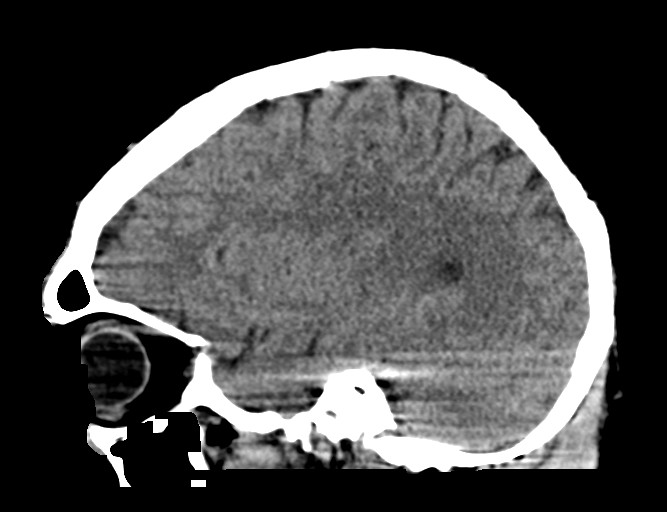
[im 30/59  brain]
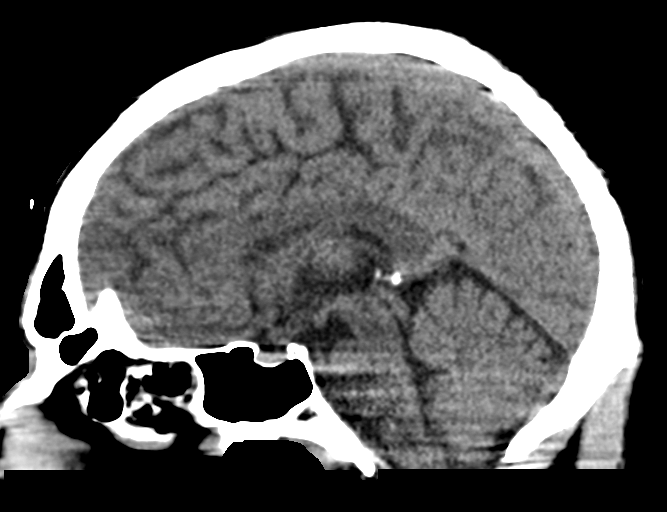
[im 39/59  brain]
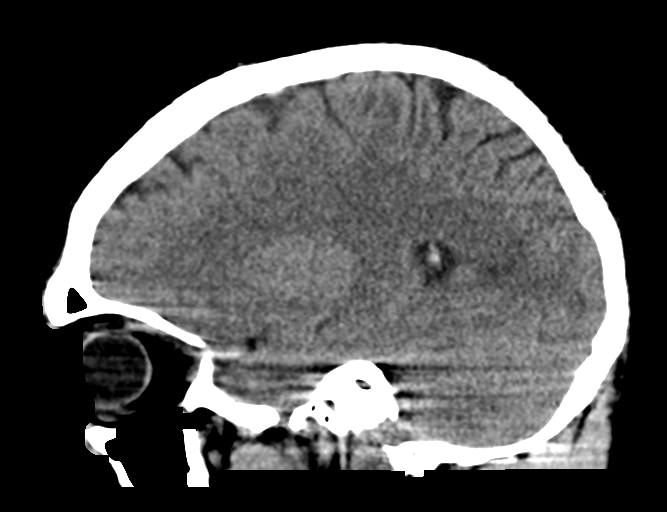

[16 of 47 positions shown; findings below may reference images not displayed]

FINDINGS: CT HEAD FINDINGS

Brain: No evidence of acute infarction, hemorrhage, hydrocephalus,
extra-axial collection or mass lesion/mass effect.

Vascular: No hyperdense vessel or unexpected calcification.

Skull: Normal. Negative for fracture or focal lesion.

Sinuses/Orbits: No acute finding. Mild multifocal mucosal thickening
in the maxillary sinuses bilaterally.

Other: None.

CT CERVICAL SPINE FINDINGS

Alignment: Normal.

Skull base and vertebrae: No acute fracture. No primary bone lesion
or focal pathologic process.

Soft tissues and spinal canal: No prevertebral fluid or swelling. No
visible canal hematoma.

Disc levels: No significant degenerative disc disease or facet
arthropathy.

Upper chest: Negative.

Other: None.
IMPRESSION: 1. No acute intracranial abnormalities. The appearance of the brain
is normal.
2. No acute abnormality of the cervical spine.

## 2024-02-22 DIAGNOSIS — A419 Sepsis, unspecified organism: Secondary | ICD-10-CM | POA: Insufficient documentation

## 2024-02-22 DIAGNOSIS — T8452XA Infection and inflammatory reaction due to internal left hip prosthesis, initial encounter: Secondary | ICD-10-CM | POA: Diagnosis not present

## 2024-02-22 DIAGNOSIS — M25552 Pain in left hip: Secondary | ICD-10-CM | POA: Diagnosis present

## 2024-02-22 DIAGNOSIS — Y792 Prosthetic and other implants, materials and accessory orthopedic devices associated with adverse incidents: Secondary | ICD-10-CM | POA: Insufficient documentation

## 2024-02-23 ENCOUNTER — Emergency Department: Payer: MEDICAID

## 2024-02-23 ENCOUNTER — Other Ambulatory Visit: Payer: Self-pay

## 2024-02-23 ENCOUNTER — Emergency Department
Admission: EM | Admit: 2024-02-23 | Discharge: 2024-02-23 | Disposition: A | Discharge status: Other Acute Inpatient Hospital | Payer: MEDICAID | Attending: Emergency Medicine | Admitting: Emergency Medicine

## 2024-02-23 ENCOUNTER — Encounter: Payer: Self-pay | Admitting: Emergency Medicine

## 2024-02-23 DIAGNOSIS — A419 Sepsis, unspecified organism: Secondary | ICD-10-CM

## 2024-02-23 DIAGNOSIS — Z743 Need for continuous supervision: Secondary | ICD-10-CM | POA: Diagnosis not present

## 2024-02-23 DIAGNOSIS — T8452XA Infection and inflammatory reaction due to internal left hip prosthesis, initial encounter: Secondary | ICD-10-CM

## 2024-02-23 LAB — CBC WITH DIFFERENTIAL/PLATELET
Abs Immature Granulocytes: 0.09 K/uL — ABNORMAL HIGH (ref 0.00–0.07)
Basophils Absolute: 0.1 K/uL (ref 0.0–0.1)
Basophils Relative: 1 %
Eosinophils Absolute: 0 K/uL (ref 0.0–0.5)
Eosinophils Relative: 0 %
HCT: 31.3 % — ABNORMAL LOW (ref 39.0–52.0)
Hemoglobin: 9.9 g/dL — ABNORMAL LOW (ref 13.0–17.0)
Immature Granulocytes: 1 %
Lymphocytes Relative: 12 %
Lymphs Abs: 1.6 K/uL (ref 0.7–4.0)
MCH: 22.1 pg — ABNORMAL LOW (ref 26.0–34.0)
MCHC: 31.6 g/dL (ref 30.0–36.0)
MCV: 70 fL — ABNORMAL LOW (ref 80.0–100.0)
Monocytes Absolute: 1.2 K/uL — ABNORMAL HIGH (ref 0.1–1.0)
Monocytes Relative: 9 %
Neutro Abs: 10.1 K/uL — ABNORMAL HIGH (ref 1.7–7.7)
Neutrophils Relative %: 77 %
Platelets: 463 K/uL — ABNORMAL HIGH (ref 150–400)
RBC: 4.47 MIL/uL (ref 4.22–5.81)
RDW: 15.3 % (ref 11.5–15.5)
WBC: 13.1 K/uL — ABNORMAL HIGH (ref 4.0–10.5)
nRBC: 0 % (ref 0.0–0.2)

## 2024-02-23 LAB — COMPREHENSIVE METABOLIC PANEL WITH GFR
ALT: 17 U/L (ref 0–44)
AST: 15 U/L (ref 15–41)
Albumin: 3.3 g/dL — ABNORMAL LOW (ref 3.5–5.0)
Alkaline Phosphatase: 91 U/L (ref 38–126)
Anion gap: 11 (ref 5–15)
BUN: 17 mg/dL (ref 6–20)
CO2: 23 mmol/L (ref 22–32)
Calcium: 9 mg/dL (ref 8.9–10.3)
Chloride: 99 mmol/L (ref 98–111)
Creatinine, Ser: 0.68 mg/dL (ref 0.61–1.24)
GFR, Estimated: >60 mL/min (ref >60)
Glucose, Bld: 119 mg/dL — ABNORMAL HIGH (ref 70–99)
Potassium: 3.5 mmol/L (ref 3.5–5.1)
Sodium: 133 mmol/L — ABNORMAL LOW (ref 135–145)
Total Bilirubin: 0.5 mg/dL (ref 0.0–1.2)
Total Protein: 7.8 g/dL (ref 6.5–8.1)

## 2024-02-23 LAB — URINALYSIS, W/ REFLEX TO CULTURE (INFECTION SUSPECTED)
Bacteria, UA: NONE SEEN
Bilirubin Urine: NEGATIVE
Glucose, UA: NEGATIVE mg/dL
Hgb urine dipstick: NEGATIVE
Ketones, ur: NEGATIVE mg/dL
Leukocytes,Ua: NEGATIVE
Nitrite: NEGATIVE
Protein, ur: NEGATIVE mg/dL
RBC / HPF: 0 RBC/hpf (ref 0–5)
Specific Gravity, Urine: 1.021 (ref 1.005–1.030)
Squamous Epithelial / HPF: 0 /HPF (ref 0–5)
pH: 6 (ref 5.0–8.0)

## 2024-02-23 LAB — SEDIMENTATION RATE: Sed Rate: 105 mm/h — ABNORMAL HIGH (ref 0–15)

## 2024-02-23 LAB — PROTIME-INR
INR: 1.2 (ref 0.8–1.2)
Prothrombin Time: 15.6 s — ABNORMAL HIGH (ref 11.4–15.2)

## 2024-02-23 LAB — LACTIC ACID, PLASMA: Lactic Acid, Venous: 1.2 mmol/L (ref 0.5–1.9)

## 2024-02-23 LAB — C-REACTIVE PROTEIN: CRP: 14.9 mg/dL — ABNORMAL HIGH (ref <1.0)

## 2024-02-23 MED ORDER — KETOROLAC TROMETHAMINE 15 MG/ML IJ SOLN
15.0000 mg | Freq: Once | INTRAMUSCULAR | Status: AC
  Administered 2024-02-23: 15 mg via INTRAVENOUS
  Filled 2024-02-23: qty 1

## 2024-02-23 MED ORDER — VANCOMYCIN HCL 2000 MG/400ML IV SOLN
2000.0000 mg | Freq: Once | INTRAVENOUS | Status: AC
  Administered 2024-02-23: 2000 mg via INTRAVENOUS
  Filled 2024-02-23: qty 400

## 2024-02-23 MED ORDER — MORPHINE SULFATE (PF) 4 MG/ML IV SOLN
4.0000 mg | Freq: Once | INTRAVENOUS | Status: AC
  Administered 2024-02-23: 4 mg via INTRAVENOUS
  Filled 2024-02-23: qty 1

## 2024-02-23 MED ORDER — PIPERACILLIN-TAZOBACTAM 3.375 G IVPB
3.3750 g | Freq: Three times a day (TID) | INTRAVENOUS | Status: DC
  Administered 2024-02-23: 3.375 g via INTRAVENOUS
  Filled 2024-02-23: qty 50

## 2024-02-23 MED ORDER — PIPERACILLIN-TAZOBACTAM 3.375 G IVPB 30 MIN
3.3750 g | Freq: Once | INTRAVENOUS | Status: AC
  Administered 2024-02-23: 3.375 g via INTRAVENOUS
  Filled 2024-02-23: qty 50

## 2024-02-23 MED ORDER — LACTATED RINGERS IV BOLUS (SEPSIS)
1000.0000 mL | Freq: Once | INTRAVENOUS | Status: AC
  Administered 2024-02-23: 1000 mL via INTRAVENOUS

## 2024-02-23 MED ORDER — IOHEXOL 300 MG/ML  SOLN
100.0000 mL | Freq: Once | INTRAMUSCULAR | Status: AC | PRN
  Administered 2024-02-23: 100 mL via INTRAVENOUS

## 2024-02-23 MED ORDER — PIPERACILLIN-TAZOBACTAM 3.375 G IVPB 30 MIN: 3.3750 g | Freq: Four times a day (QID) | INTRAVENOUS | Status: DC

## 2024-02-23 MED ORDER — ACETAMINOPHEN 500 MG PO TABS
1000.0000 mg | ORAL_TABLET | Freq: Once | ORAL | Status: AC
  Administered 2024-02-23: 1000 mg via ORAL
  Filled 2024-02-23: qty 2

## 2024-02-23 NOTE — ED Notes (Signed)
 This RN gave an update to [place], Charity fundraiser in the transfer center at [facility] at this time.

## 2024-02-23 NOTE — ED Notes (Signed)
 Called to [facility] per MD Wong/Face Sheet Faxed/Rep Apolinar.

## 2024-02-23 NOTE — ED Triage Notes (Signed)
 Pt reports left hip hip pain and possible infection (left hip abscess), he has noticed for several days. He says he has had prior infection in the same hip.

## 2024-02-23 NOTE — ED Provider Notes (Signed)
 [facility] Provider Note    Event Date/Time   First MD Initiated Contact with Patient 02/23/24 0017     (approximate)   History   Hip Pain   HPI  [name] is a 33 y.o. male   Past medical history of substance use, on methadone , complicated left hip history with hip replacement, dislocation, complicated by infected joint managed at [facility] earlier this summer who presents to the emergency department with worsening left hip pain, atraumatic, fever, reminiscent of the pain associated with his hip infection recently this summer.  He noticed that the surgical scar on the lateral side of his left hip has developed a large bump.  He denies IV drug use.  He no longer drinks alcohol.  He is on methadone .  He still uses crack cocaine occasionally last use was yesterday.   External Medical Documents Reviewed: Discharge summary from [facility] documenting his hip infx and surgeries recently      Physical Exam   Triage Vital Signs: ED Triage Vitals  Encounter Vitals Group     BP 02/23/24 0003 (!) 139/98     Girls Systolic BP Percentile --      Girls Diastolic BP Percentile --      Boys Systolic BP Percentile --      Boys Diastolic BP Percentile --      Pulse Rate 02/23/24 0003 (!) 106     Resp 02/23/24 0003 (!) 22     Temp 02/23/24 0003 (!) 100.6 F (38.1 C)     Temp Source 02/23/24 0003 Oral     SpO2 02/23/24 0003 100 %     Weight --      Height --      Head Circumference --      Peak Flow --      Pain Score 02/23/24 0004 10     Pain Loc --      Pain Education --      Exclude from Growth Chart --     Most recent vital signs: Vitals:   02/23/24 0600 02/23/24 0630  BP: 116/62 107/70  Pulse: (!) 59 (!) 56  Resp: 18 17  Temp:    SpO2: 98% 100%    General: Awake, no distress.  CV:  Good peripheral perfusion.  Resp:  Normal effort.  Abd:  No distention.  Other:  Vital signs significant initially in triage for tachycardia, tachypnea, fever.  He is  unable to range the left hip due to pain.  He is neurovascular intact to the left hip.  There is a fluctuant mass about golf ball size to the left hip where the surgical scars appear otherwise well-healed.   ED Results / Procedures / Treatments   Labs (all labs ordered are listed, but only abnormal results are displayed) Labs Reviewed  COMPREHENSIVE METABOLIC PANEL WITH GFR - Abnormal; Notable for the following components:      Result Value   Sodium 133 (*)    Glucose, Bld 119 (*)    Albumin 3.3 (*)    All other components within normal limits  CBC WITH DIFFERENTIAL/PLATELET - Abnormal; Notable for the following components:   WBC 13.1 (*)    Hemoglobin 9.9 (*)    HCT 31.3 (*)    MCV 70.0 (*)    MCH 22.1 (*)    Platelets 463 (*)    Neutro Abs 10.1 (*)    Monocytes Absolute 1.2 (*)    Abs Immature Granulocytes 0.09 (*)  All other components within normal limits  PROTIME-INR - Abnormal; Notable for the following components:   Prothrombin Time 15.6 (*)    All other components within normal limits  SEDIMENTATION RATE - Abnormal; Notable for the following components:   Sed Rate 105 (*)    All other components within normal limits  CULTURE, BLOOD (ROUTINE X 2)  CULTURE, BLOOD (ROUTINE X 2)  LACTIC ACID, PLASMA  URINALYSIS, W/ REFLEX TO CULTURE (INFECTION SUSPECTED)  C-REACTIVE PROTEIN     I ordered and reviewed the above labs they are notable for white blood cell count 13, lactic normal.  EKG  ED ECG REPORT I, Ginnie Shams, the attending physician, personally viewed and interpreted this ECG.   Date: 02/23/2024  EKG Time: 0111  Rate: 85  Rhythm: sinus  Axis: nl  Intervals:nl  ST&T Change: no stemi    RADIOLOGY I independently reviewed and interpreted hip CT and see a large fluid collection surrounding the hip leading all the way out to the skin I also reviewed radiologist's formal read.   PROCEDURES:  Critical Care performed: Yes, see critical care procedure  note(s)  .Critical Care  Performed by: Shams Ginnie, MD Authorized by: Shams Ginnie, MD   Critical care provider statement:    Critical care time (minutes):  45   Critical care was time spent personally by me on the following activities:  Development of treatment plan with patient or surrogate, discussions with consultants, evaluation of patient's response to treatment, examination of patient, ordering and review of laboratory studies, ordering and review of radiographic studies, ordering and performing treatments and interventions, pulse oximetry, re-evaluation of patient's condition and review of old charts    MEDICATIONS ORDERED IN ED: Medications  piperacillin -tazobactam (ZOSYN ) IVPB 3.375 g (has no administration in time range)  lactated ringers  bolus 1,000 mL (0 mLs Intravenous Stopped 02/23/24 0325)  acetaminophen  (TYLENOL ) tablet 1,000 mg (1,000 mg Oral Given 02/23/24 0101)  ketorolac  (TORADOL ) 15 MG/ML injection 15 mg (15 mg Intravenous Given 02/23/24 0101)  morphine  (PF) 4 MG/ML injection 4 mg (4 mg Intravenous Given 02/23/24 0101)  piperacillin -tazobactam (ZOSYN ) IVPB 3.375 g (0 g Intravenous Stopped 02/23/24 0146)  vancomycin  (VANCOREADY) IVPB 2000 mg/400 mL (0 mg Intravenous Stopped 02/23/24 0349)  iohexol  (OMNIPAQUE ) 300 MG/ML solution 100 mL (100 mLs Intravenous Contrast Given 02/23/24 0118)    External physician / consultants:  I spoke with [facility] transfer center hospitalist regarding care plan for this patient.   IMPRESSION / MDM / ASSESSMENT AND PLAN / ED COURSE  I reviewed the triage vital signs and the nursing notes.                                Patient's presentation is most consistent with acute presentation with potential threat to life or bodily function.  Differential diagnosis includes, but is not limited to, sepsis due to hip infection   The patient is on the cardiac monitor to evaluate for evidence of arrhythmia and/or significant heart rate changes.  MDM: Sepsis  due to hip infection with a history of the same, extensive fluid collection leading to the skin abscess all the way down into the hip joint per CT scan, initially vital signs abnormal but after fluids antibiotics antipyretics have normalized.  Pain control offered to patient as well.  Given extensive history recently at [facility] managed by their orthopedic surgeons will transfer there for washout and further surgical management of his infection.  Given broad-spectrum antibiotics.  Accepted by [facility], awaiting bed availability for transfer at the time of signout.        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    Cyrena Mylar, MD 02/23/24 [phone]

## 2024-02-23 NOTE — ED Notes (Signed)
 Patient signed transfer consent at this time with this RN as the witness.

## 2024-02-23 NOTE — Progress Notes (Signed)
 ED Pharmacy Antibiotic Sign Off An antibiotic consult was received from an ED provider for Vancomycin  per pharmacy dosing for wound infection. A chart review was completed to assess appropriateness.   The following one time order(s) were placed:  Vancomycin  2000 mg per pt wt: 98.4 kg from 01/16/24  Further antibiotic and/or antibiotic pharmacy consults should be ordered by the admitting provider if indicated.   Thank you for allowing pharmacy to be a part of this patient's care.   Thank you, Rankin CANDIE Dills, PharmD, [facility] 02/23/2024 12:47 AM

## 2024-02-28 LAB — CULTURE, BLOOD (ROUTINE X 2)
Culture: NO GROWTH
Culture: NO GROWTH
Special Requests: ADEQUATE
Special Requests: ADEQUATE
